# Patient Record
Sex: Female | Born: 1960 | Race: White | Hispanic: No | Marital: Married | Smoking: Former smoker
Health system: Southern US, Community
[De-identification: ages and names within clinical notes are randomized; demographics above are authoritative.]

## PROBLEM LIST (undated history)

## (undated) DIAGNOSIS — K635 Polyp of colon: Secondary | ICD-10-CM

## (undated) DIAGNOSIS — Z803 Family history of malignant neoplasm of breast: Secondary | ICD-10-CM

## (undated) DIAGNOSIS — K5792 Diverticulitis of intestine, part unspecified, without perforation or abscess without bleeding: Secondary | ICD-10-CM

## (undated) DIAGNOSIS — E079 Disorder of thyroid, unspecified: Secondary | ICD-10-CM

## (undated) DIAGNOSIS — R011 Cardiac murmur, unspecified: Secondary | ICD-10-CM

## (undated) DIAGNOSIS — F32A Depression, unspecified: Secondary | ICD-10-CM

## (undated) DIAGNOSIS — I10 Essential (primary) hypertension: Secondary | ICD-10-CM

## (undated) HISTORY — DX: Polyp of colon: K63.5

## (undated) HISTORY — DX: Diverticulitis of intestine, part unspecified, without perforation or abscess without bleeding: K57.92

## (undated) HISTORY — PX: URETHRAL SLING: SHX2621

## (undated) HISTORY — DX: Disorder of thyroid, unspecified: E07.9

## (undated) HISTORY — PX: SPINE SURGERY: SHX786

## (undated) HISTORY — DX: Family history of malignant neoplasm of breast: Z80.3

## (undated) HISTORY — DX: Cardiac murmur, unspecified: R01.1

## (undated) HISTORY — DX: Depression, unspecified: F32.A

## (undated) HISTORY — DX: Essential (primary) hypertension: I10

---

## 2008-11-18 HISTORY — PX: BACK SURGERY: SHX140

## 2014-08-10 ENCOUNTER — Ambulatory Visit (INDEPENDENT_AMBULATORY_CARE_PROVIDER_SITE_OTHER): Payer: BC Managed Care – PPO | Admitting: Emergency Medicine

## 2014-08-10 VITALS — BP 110/72 | HR 60 | Temp 98.3°F | Resp 16 | Ht 64.25 in | Wt 160.8 lb

## 2014-08-10 DIAGNOSIS — G4700 Insomnia, unspecified: Secondary | ICD-10-CM

## 2014-08-10 DIAGNOSIS — E039 Hypothyroidism, unspecified: Secondary | ICD-10-CM

## 2014-08-10 MED ORDER — ZOLPIDEM TARTRATE 5 MG PO TABS: 5.0000 mg | ORAL_TABLET | Freq: Every evening | ORAL | Status: DC | PRN

## 2014-08-10 NOTE — Progress Notes (Signed)
[facility] [address] [place] [address] [phone]  Date:  08/10/2014   Name:  Lisa Lynn   DOB:  [birth year]   MRN:  [id number]  PCP:  No PCP Per Patient    Chief Complaint: Insomnia   History of Present Illness:  Lisa Lynn is a 53 y.o. very pleasant female patient who presents with the following:  Has moved here from [place] and was on [place] for the past several years for treatment of insomnia Has exhausted her supply of medication and needs a refill. Is looking for a primary care physician No improvement with over the counter medications or other home remedies.  Denies other complaint or health concern today.   There are no active problems to display for this patient.   Past Medical History  Diagnosis Date  . Heart murmur   . Thyroid disease     Past Surgical History  Procedure Laterality Date  . Spine surgery      History  Substance Use Topics  . Smoking status: Never Smoker   . Smokeless tobacco: Not on file  . Alcohol Use: Yes     Comment: 2-4 drinks a week    Family History  Problem Relation Age of Onset  . Cancer Mother   . Hypertension Mother   . Heart disease Father   . Stroke Maternal Grandmother   . Cancer Paternal Grandmother   . Cancer Paternal Grandfather     No Known Allergies  Medication list has been reviewed and updated.  No current outpatient prescriptions on file prior to visit.   No current facility-administered medications on file prior to visit.    Review of Systems:  As per HPI, otherwise negative.    Physical Examination: Filed Vitals:   08/10/14 1933  BP: 110/72  Pulse: 60  Temp: 98.3 F (36.8 C)  Resp: 16   Filed Vitals:   08/10/14 1933  Height: 5' 4.25" (1.632 m)  Weight: 160 lb 12.8 oz (72.938 kg)   Body mass index is 27.39 kg/(m^2). Ideal Body Weight: Weight in (lb) to have BMI = 25: 146.5  GEN: WDWN, NAD, Non-toxic, A & O x 3 HEENT: Atraumatic,  Normocephalic. Neck supple. No masses, No LAD. Ears and Nose: No external deformity. CV: RRR, No M/G/R. No JVD. No thrill. No extra heart sounds. PULM: CTA B, no wheezes, crackles, rhonchi. No retractions. No resp. distress. No accessory muscle use. ABD: S, NT, ND, +BS. No rebound. No HSM. EXTR: No c/c/e NEURO Normal gait.  PSYCH: Normally interactive. Conversant. Not depressed or anxious appearing.  Calm demeanor.    Assessment and Plan: Insomnia Hypothyroid Refill ambien Refer to 104    Signed,  Phillips Odor, MD

## 2014-08-10 NOTE — Patient Instructions (Signed)
Insomnia Insomnia is frequent trouble falling and/or staying asleep. Insomnia can be a long term problem or a short term problem. Both are common. Insomnia can be a short term problem when the wakefulness is related to a certain stress or worry. Long term insomnia is often related to ongoing stress during waking hours and/or poor sleeping habits. Overtime, sleep deprivation itself can make the problem worse. Every little thing feels more severe because you are overtired and your ability to cope is decreased. CAUSES   Stress, anxiety, and depression.  Poor sleeping habits.  Distractions such as TV in the bedroom.  Naps close to bedtime.  Engaging in emotionally charged conversations before bed.  Technical reading before sleep.  Alcohol and other sedatives. They may make the problem worse. They can hurt normal sleep patterns and normal dream activity.  Stimulants such as caffeine for several hours prior to bedtime.  Pain syndromes and shortness of breath can cause insomnia.  Exercise late at night.  Changing time zones may cause sleeping problems (jet lag). It is sometimes helpful to have someone observe your sleeping patterns. They should look for periods of not breathing during the night (sleep apnea). They should also look to see how long those periods last. If you live alone or observers are uncertain, you can also be observed at a sleep clinic where your sleep patterns will be professionally monitored. Sleep apnea requires a checkup and treatment. Give your caregivers your medical history. Give your caregivers observations your family has made about your sleep.  SYMPTOMS   Not feeling rested in the morning.  Anxiety and restlessness at bedtime.  Difficulty falling and staying asleep. TREATMENT   Your caregiver may prescribe treatment for an underlying medical disorders. Your caregiver can give advice or help if you are using alcohol or other drugs for self-medication. Treatment  of underlying problems will usually eliminate insomnia problems.  Medications can be prescribed for short time use. They are generally not recommended for lengthy use.  Over-the-counter sleep medicines are not recommended for lengthy use. They can be habit forming.  You can promote easier sleeping by making lifestyle changes such as:  Using relaxation techniques that help with breathing and reduce muscle tension.  Exercising earlier in the day.  Changing your diet and the time of your last meal. No night time snacks.  Establish a regular time to go to bed.  Counseling can help with stressful problems and worry.  Soothing music and white noise may be helpful if there are background noises you cannot remove.  Stop tedious detailed work at least one hour before bedtime. HOME CARE INSTRUCTIONS   Keep a diary. Inform your caregiver about your progress. This includes any medication side effects. See your caregiver regularly. Take note of:  Times when you are asleep.  Times when you are awake during the night.  The quality of your sleep.  How you feel the next day. This information will help your caregiver care for you.  Get out of bed if you are still awake after 15 minutes. Read or do some quiet activity. Keep the lights down. Wait until you feel sleepy and go back to bed.  Keep regular sleeping and waking hours. Avoid naps.  Exercise regularly.  Avoid distractions at bedtime. Distractions include watching television or engaging in any intense or detailed activity like attempting to balance the household checkbook.  Develop a bedtime ritual. Keep a familiar routine of bathing, brushing your teeth, climbing into bed at the same   time each night, listening to soothing music. Routines increase the success of falling to sleep faster.  Use relaxation techniques. This can be using breathing and muscle tension release routines. It can also include visualizing peaceful scenes. You can  also help control troubling or intruding thoughts by keeping your mind occupied with boring or repetitive thoughts like the old concept of counting sheep. You can make it more creative like imagining planting one beautiful flower after another in your backyard garden.  During your day, work to eliminate stress. When this is not possible use some of the previous suggestions to help reduce the anxiety that accompanies stressful situations. MAKE SURE YOU:   Understand these instructions.  Will watch your condition.  Will get help right away if you are not doing well or get worse. Document Released: 11/01/2000 Document Revised: 01/27/2012 Document Reviewed: 12/02/2007 ExitCare Patient Information 2015 [employer]. This information is not intended to replace advice given to you by your health care provider. Make sure you discuss any questions you have with your health care provider.  

## 2014-08-22 NOTE — Progress Notes (Signed)
LMVM for pt to CB to schedule appt. 

## 2014-08-30 NOTE — Progress Notes (Signed)
LMOM for pt to call to sche appt at 104 for f/u

## 2014-08-31 ENCOUNTER — Encounter: Payer: Self-pay | Admitting: Family Medicine

## 2014-08-31 NOTE — Progress Notes (Signed)
Left a message for patient to return call also sent an unable to reach letter.

## 2014-09-29 ENCOUNTER — Other Ambulatory Visit (HOSPITAL_COMMUNITY)
Admission: RE | Admit: 2014-09-29 | Discharge: 2014-09-29 | Disposition: A | Discharge status: Home / Self Care | Payer: BC Managed Care – PPO | Source: Ambulatory Visit | Attending: Gynecology | Admitting: Gynecology

## 2014-09-29 ENCOUNTER — Encounter: Payer: Self-pay | Admitting: Gynecology

## 2014-09-29 ENCOUNTER — Ambulatory Visit (INDEPENDENT_AMBULATORY_CARE_PROVIDER_SITE_OTHER): Payer: BC Managed Care – PPO | Admitting: Gynecology

## 2014-09-29 VITALS — BP 124/82 | Ht 64.0 in | Wt 158.6 lb

## 2014-09-29 DIAGNOSIS — Z01419 Encounter for gynecological examination (general) (routine) without abnormal findings: Secondary | ICD-10-CM | POA: Insufficient documentation

## 2014-09-29 DIAGNOSIS — Z1151 Encounter for screening for human papillomavirus (HPV): Secondary | ICD-10-CM | POA: Diagnosis present

## 2014-09-29 DIAGNOSIS — Z780 Asymptomatic menopausal state: Secondary | ICD-10-CM

## 2014-09-29 DIAGNOSIS — Z79890 Hormone replacement therapy (postmenopausal): Secondary | ICD-10-CM

## 2014-09-29 DIAGNOSIS — I341 Nonrheumatic mitral (valve) prolapse: Secondary | ICD-10-CM

## 2014-09-29 MED ORDER — ESTRADIOL 0.1 MG/24HR TD PTTW: 1.0000 | MEDICATED_PATCH | TRANSDERMAL | Status: DC

## 2014-09-29 MED ORDER — PROGESTERONE MICRONIZED 200 MG PO CAPS: 200.0000 mg | ORAL_CAPSULE | Freq: Every day | ORAL | Status: DC

## 2014-09-29 NOTE — Patient Instructions (Signed)
kkTdap Vaccine (Tetanus, Diphtheria, Pertussis): What You Need to Know 1. Why get vaccinated? Tetanus, diphtheria and pertussis can be very serious diseases, even for adolescents and adults. Tdap vaccine can protect us from these diseases. TETANUS (Lockjaw) causes painful muscle tightening and stiffness, usually all over the body.  It can lead to tightening of muscles in the head and neck so you can't open your mouth, swallow, or sometimes even breathe. Tetanus kills about 1 out of 5 people who are infected. DIPHTHERIA can cause a thick coating to form in the back of the throat.  It can lead to breathing problems, paralysis, heart failure, and death. PERTUSSIS (Whooping Cough) causes severe coughing spells, which can cause difficulty breathing, vomiting and disturbed sleep.  It can also lead to weight loss, incontinence, and rib fractures. Up to 2 in 100 adolescents and 5 in 100 adults with pertussis are hospitalized or have complications, which could include pneumonia or death. These diseases are caused by bacteria. Diphtheria and pertussis are spread from person to person through coughing or sneezing. Tetanus enters the body through cuts, scratches, or wounds. Before vaccines, the [place] saw as many as 200,000 cases a year of diphtheria and pertussis, and hundreds of cases of tetanus. Since vaccination began, tetanus and diphtheria have dropped by about 99% and pertussis by about 80%. 2. Tdap vaccine Tdap vaccine can protect adolescents and adults from tetanus, diphtheria, and pertussis. One dose of Tdap is routinely given at age 111 or 2512. People who did not get Tdap at that age should get it as soon as possible. Tdap is especially important for health care professionals and anyone having close contact with a baby younger than 12 months. Pregnant women should get a dose of Tdap during every pregnancy, to protect the newborn from pertussis. Infants are most at risk for severe,  life-threatening complications from pertussis. A similar vaccine, called Td, protects from tetanus and diphtheria, but not pertussis. A Td booster should be given every 10 years. Tdap may be given as one of these boosters if you have not already gotten a dose. Tdap may also be given after a severe cut or burn to prevent tetanus infection. Your doctor can give you more information. Tdap may safely be given at the same time as other vaccines. 3. Some people should not get this vaccine  If you ever had a life-threatening allergic reaction after a dose of any tetanus, diphtheria, or pertussis containing vaccine, OR if you have a severe allergy to any part of this vaccine, you should not get Tdap. Tell your doctor if you have any severe allergies.  If you had a coma, or long or multiple seizures within 7 days after a childhood dose of DTP or DTaP, you should not get Tdap, unless a cause other than the vaccine was found. You can still get Td.  Talk to your doctor if you:  have epilepsy or another nervous system problem,  had severe pain or swelling after any vaccine containing diphtheria, tetanus or pertussis,  ever had Guillain-Barr Syndrome (GBS),  aren't feeling well on the day the shot is scheduled. 4. Risks of a vaccine reaction With any medicine, including vaccines, there is a chance of side effects. These are usually mild and go away on their own, but serious reactions are also possible. Brief fainting spells can follow a vaccination, leading to injuries from falling. Sitting or lying down for about 15 minutes can help prevent these. Tell your doctor if you feel  dizzy or light-headed, or have vision changes or ringing in the ears. Mild problems following Tdap (Did not interfere with activities)  Pain where the shot was given (about 3 in 4 adolescents or 2 in 3 adults)  Redness or swelling where the shot was given (about 1 person in 5)  Mild fever of at least 100.41F (up to about 1 in  25 adolescents or 1 in 100 adults)  Headache (about 3 or 4 people in 10)  Tiredness (about 1 person in 3 or 4)  Nausea, vomiting, diarrhea, stomach ache (up to 1 in 4 adolescents or 1 in 10 adults)  Chills, body aches, sore joints, rash, swollen glands (uncommon) Moderate problems following Tdap (Interfered with activities, but did not require medical attention)  Pain where the shot was given (about 1 in 5 adolescents or 1 in 100 adults)  Redness or swelling where the shot was given (up to about 1 in 16 adolescents or 1 in 25 adults)  Fever over 102F (about 1 in 100 adolescents or 1 in 250 adults)  Headache (about 3 in 20 adolescents or 1 in 10 adults)  Nausea, vomiting, diarrhea, stomach ache (up to 1 or 3 people in 100)  Swelling of the entire arm where the shot was given (up to about 3 in 100). Severe problems following Tdap (Unable to perform usual activities; required medical attention)  Swelling, severe pain, bleeding and redness in the arm where the shot was given (rare). A severe allergic reaction could occur after any vaccine (estimated less than 1 in a million doses). 5. What if there is a serious reaction? What should I look for?  Look for anything that concerns you, such as signs of a severe allergic reaction, very high fever, or behavior changes. Signs of a severe allergic reaction can include hives, swelling of the face and throat, difficulty breathing, a fast heartbeat, dizziness, and weakness. These would start a few minutes to a few hours after the vaccination. What should I do?  If you think it is a severe allergic reaction or other emergency that can't wait, call 9-1-1 or get the person to the nearest hospital. Otherwise, call your doctor.  Afterward, the reaction should be reported to the "Vaccine Adverse Event Reporting System" (VAERS). Your doctor might file this report, or you can do it yourself through the VAERS web site at www.vaers.LAgents.no, or by  calling 1-636 840 8494. VAERS is only for reporting reactions. They do not give medical advice.  6. The National Vaccine Injury Compensation Program The [employer] Vaccine Injury Compensation Program (VICP) is a federal program that was created to compensate people who may have been injured by certain vaccines. Persons who believe they may have been injured by a vaccine can learn about the program and about filing a claim by calling 1-715 581 2927 or visiting the VICP website at SpiritualWord.at. 7. How can I learn more?  Ask your doctor.  Call your local or state health department.  Contact the Centers for Disease Control and Prevention (CDC):  Call (631)524-5944 or visit CDC's website at PicCapture.uy. CDC Tdap Vaccine VIS (03/26/12) Document Released: 05/05/2012 Document Revised: 03/21/2014 Document Reviewed: 02/16/2014 ExitCare Patient Information 2015 [place], [place]. This information is not intended to replace advice given to you by your health care provider. Make sure you discuss any questions you have with your health care provider. Hormone Therapy At menopause, your body begins making less estrogen and progesterone hormones. This causes the body to stop having menstrual periods. This is because  estrogen and progesterone hormones control your periods and menstrual cycle. A lack of estrogen may cause symptoms such as:  Hot flushes (or hot flashes).  Vaginal dryness.  Dry skin.  Loss of sex drive.  Risk of bone loss (osteoporosis). When this happens, you may choose to take hormone therapy to get back the estrogen lost during menopause. When the hormone estrogen is given alone, it is usually referred to as ET (Estrogen Therapy). When the hormone progestin is combined with estrogen, it is generally called HT (Hormone Therapy). This was formerly known as hormone replacement therapy (HRT). Your caregiver can help you make a decision on what will be best for you.  The decision to use HT seems to change often as new studies are done. Many studies do not agree on the benefits of hormone replacement therapy. LIKELY BENEFITS OF HT INCLUDE PROTECTION FROM:  Hot Flushes (also called hot flashes) - A hot flush is a sudden feeling of heat that spreads over the face and body. The skin may redden like a blush. It is connected with sweats and sleep disturbance. Women going through menopause may have hot flushes a few times a month or several times per day depending on the woman.  Osteoporosis (bone loss)- Estrogen helps guard against bone loss. After menopause, a woman's bones slowly lose calcium and become weak and brittle. As a result, bones are more likely to break. The hip, wrist, and spine are affected most often. Hormone therapy can help slow bone loss after menopause. Weight bearing exercise and taking calcium with vitamin D also can help prevent bone loss. There are also medications that your caregiver can prescribe that can help prevent osteoporosis.  Vaginal Dryness - Loss of estrogen causes changes in the vagina. Its lining may become thin and dry. These changes can cause pain and bleeding during sexual intercourse. Dryness can also lead to infections. This can cause burning and itching. (Vaginal estrogen treatment can help relieve pain, itching, and dryness.)  Urinary Tract Infections are more common after menopause because of lack of estrogen. Some women also develop urinary incontinence because of low estrogen levels in the vagina and bladder.  Possible other benefits of estrogen include a positive effect on mood and short-term memory in women. RISKS AND COMPLICATIONS  Using estrogen alone without progesterone causes the lining of the uterus to grow. This increases the risk of lining of the uterus (endometrial) cancer. Your caregiver should give another hormone called progestin if you have a uterus.  Women who take combined (estrogen and progestin) HT  appear to have an increased risk of breast cancer. The risk appears to be small, but increases throughout the time that HT is taken.  Combined therapy also makes the breast tissue slightly denser which makes it harder to read mammograms (breast X-rays).  Combined, estrogen and progesterone therapy can be taken together every day, in which case there may be spotting of blood. HT therapy can be taken cyclically in which case you will have menstrual periods. Cyclically means HT is taken for a set amount of days, then not taken, then this process is repeated.  HT may increase the risk of stroke, heart attack, breast cancer and forming blood clots in your leg.  Transdermal estrogen (estrogen that is absorbed through the skin with a patch or a cream) may have more positive results with:  Cholesterol.  Blood pressure.  Blood clots. Having the following conditions may indicate you should not have HT:  Endometrial cancer.  Liver  disease.  Breast cancer.  Heart disease.  History of blood clots.  Stroke. TREATMENT   If you choose to take HT and have a uterus, usually estrogen and progestin are prescribed.  Your caregiver will help you decide the best way to take the medications.  Possible ways to take estrogen include:  Pills.  Patches.  Gels.  Sprays.  Vaginal estrogen cream, rings and tablets.  It is best to take the lowest dose possible that will help your symptoms and take them for the shortest period of time that you can.  Hormone therapy can help relieve some of the problems (symptoms) that affect women at menopause. Before making a decision about HT, talk to your caregiver about what is best for you. Be well informed and comfortable with your decisions. HOME CARE INSTRUCTIONS   Follow your caregivers advice when taking the medications.  A Pap test is done to screen for cervical cancer.  The first Pap test should be done at age 53.  Between ages 3121 and 5729, Pap  tests are repeated every 2 years.  Beginning at age 53, you are advised to have a Pap test every 3 years as long as your past 3 Pap tests have been normal.  Some women have medical problems that increase the chance of getting cervical cancer. Talk to your caregiver about these problems. It is especially important to talk to your caregiver if a new problem develops soon after your last Pap test. In these cases, your caregiver may recommend more frequent screening and Pap tests.  The above recommendations are the same for women who have or have not gotten the vaccine for HPV (Human Papillomavirus).  If you had a hysterectomy for a problem that was not a cancer or a condition that could lead to cancer, then you no longer need Pap tests. However, even if you no longer need a Pap test, a regular exam is a good idea to make sure no other problems are starting.   If you are between ages 2465 and 4970, and you have had normal Pap tests going back 10 years, you no longer need Pap tests. However, even if you no longer need a Pap test, a regular exam is a good idea to make sure no other problems are starting.   If you have had past treatment for cervical cancer or a condition that could lead to cancer, you need Pap tests and screening for cancer for at least 20 years after your treatment.  If Pap tests have been discontinued, risk factors (such as a new sexual partner) need to be re-assessed to determine if screening should be resumed.  Some women may need screenings more often if they are at high risk for cervical cancer.  Get mammograms done as per the advice of your caregiver. SEEK IMMEDIATE MEDICAL CARE IF:  You develop abnormal vaginal bleeding.  You have pain or swelling in your legs, shortness of breath, or chest pain.  You develop dizziness or headaches.  You have lumps or changes in your breasts or armpits.  You have slurred speech.  You develop weakness or numbness of your arms or  legs.  You have pain, burning, or bleeding when urinating.  You develop abdominal pain. Document Released: 08/03/2003 Document Revised: 01/27/2012 Document Reviewed: 11/21/2010 [facility] Patient Information 2015 [place], [place]. This information is not intended to replace advice given to you by your health care provider. Make sure you discuss any questions you have with your health care provider.

## 2014-09-29 NOTE — Progress Notes (Signed)
Lisa Lynn December 30, 1960 [id number]   History:    53 y.o.  for annual gyn exam who is a new patient to the practice. [place]. She reports her last gynecological examination in 2014. Patient denies any past history of abnormal Pap smears. She is being followed by Dr. Leslie DalesAltheimer  (endocrinologist) who is treating her for Hashimoto's thyroiditis. Patient with past history of mitral valve prolapse proven by echocardiography many years ago her cardiologist had released her since she was asymptomatic. Patient was started on estrogen replacement therapy for vasomotor symptoms and 2013 and she had stopped it 2 weeks ago to see if she really needed it and she would like to return to it. Patient was on Minivelle 0.1 mg twice a week transdermal patch with the addition of Prometrium 200 mg for 12 days of the month. Patient uncertain if she has received her T.vaccine. Her flu vaccine is up-to-date.  She brought records with her and in October of this year she had some spotting and they did a hysteroscopy and endometrial biopsy which were benign.  Past medical history,surgical history, family history and social history were all reviewed and documented in the EPIC chart.  Gynecologic History No LMP recorded. Patient is postmenopausal. Contraception: post menopausal status Last Pap: 2014. Results were: normal Last mammogram: 2014. Results were: normal  Obstetric History OB History  Gravida Para Term Preterm AB SAB TAB Ectopic Multiple Living  3 2   1 1    2     # Outcome Date GA Lbr Len/2nd Weight Sex Delivery Anes PTL Lv  3 SAB           2 Para           1 Para                ROS: A ROS was performed and pertinent positives and negatives are included in the history.  GENERAL: No fevers or chills. HEENT: No change in vision, no earache, sore throat or sinus congestion. NECK: No pain or stiffness. CARDIOVASCULAR: No chest pain or  pressure. No palpitations. PULMONARY: No shortness of breath, cough or wheeze. GASTROINTESTINAL: No abdominal pain, nausea, vomiting or diarrhea, melena or bright red blood per rectum. GENITOURINARY: No urinary frequency, urgency, hesitancy or dysuria. MUSCULOSKELETAL: No joint or muscle pain, no back pain, no recent trauma. DERMATOLOGIC: No rash, no itching, no lesions. ENDOCRINE: No polyuria, polydipsia, no heat or cold intolerance. No recent change in weight. HEMATOLOGICAL: No anemia or easy bruising or bleeding. NEUROLOGIC: No headache, seizures, numbness, tingling or weakness. PSYCHIATRIC: No depression, no loss of interest in normal activity or change in sleep pattern.     Exam: chaperone present  BP 124/82 mmHg  Ht 5\' 4"  (1.626 m)  Wt 158 lb 9.6 oz (71.94 kg)  BMI 27.21 kg/m2  Body mass index is 27.21 kg/(m^2).  General appearance : Well developed well nourished female. No acute distress HEENT: Neck supple, trachea midline, no carotid bruits, no thyroidmegaly Lungs: Clear to auscultation, no rhonchi or wheezes, or rib retractions  Heart: Regular rate and rhythm, no murmurs or gallops Breast:Examined in sitting and supine position were symmetrical in appearance, no palpable masses or tenderness,  no skin retraction, no nipple inversion, no nipple discharge, no skin discoloration, no axillary or supraclavicular lymphadenopathy Abdomen: no palpable masses or tenderness, no rebound or guarding Extremities: no edema or skin discoloration or tenderness  Pelvic:  Bartholin, Urethra, Skene  Glands: Within normal limits             Vagina: No gross lesions or discharge  Cervix: No gross lesions or discharge  Uterus  anteverted, normal size, shape and consistency, non-tender and mobile  Adnexa  Without masses or tenderness  Anus and perineum  normal   Rectovaginal  normal sphincter tone without palpated masses or tenderness             Hemoccult cards provided.     Assessment/Plan:  53  y.o. female for annual exam new patient to the practice. We will obtain a Pap smear to have in her records here and then we will adhered to the guidelines of Pap smears every 3 years. We discussed importance of monthly self breast exams. She will schedule her mammogram. We discussed importance of calcium vitamin D and regular exercise for osteoporosis prevention. Next year we will do a baseline bone density study. She will return back later in the week for fasting lipid profile, compresses a metabolic panel, CBC, and urinalysis. She was reminded to submit to the office the fecal Hemoccult cards for testing. She will check on her T Dap status.   Ok EdwardsFERNANDEZ,Yeimi Debnam H MD, 3:25 PM 09/29/2014

## 2014-09-30 LAB — URINALYSIS W MICROSCOPIC + REFLEX CULTURE
Bacteria, UA: NONE SEEN
Bilirubin Urine: NEGATIVE
Casts: NONE SEEN
Crystals: NONE SEEN
Glucose, UA: NEGATIVE mg/dL
Hgb urine dipstick: NEGATIVE
Ketones, ur: NEGATIVE mg/dL
Leukocytes, UA: NEGATIVE
Nitrite: NEGATIVE
Protein, ur: NEGATIVE mg/dL
Specific Gravity, Urine: 1.014 (ref 1.005–1.030)
Squamous Epithelial / LPF: NONE SEEN
Urobilinogen, UA: 0.2 mg/dL (ref 0.0–1.0)
pH: 5.5 (ref 5.0–8.0)

## 2014-10-03 LAB — CYTOLOGY - PAP

## 2014-10-04 ENCOUNTER — Other Ambulatory Visit: Payer: BC Managed Care – PPO

## 2014-10-04 LAB — CBC WITH DIFFERENTIAL/PLATELET
Basophils Absolute: 0 10*3/uL (ref 0.0–0.1)
Basophils Relative: 0 % (ref 0–1)
Eosinophils Absolute: 0.1 10*3/uL (ref 0.0–0.7)
Eosinophils Relative: 2 % (ref 0–5)
HCT: 37.7 % (ref 36.0–46.0)
Hemoglobin: 13.3 g/dL (ref 12.0–15.0)
Lymphocytes Relative: 34 % (ref 12–46)
Lymphs Abs: 1.5 10*3/uL (ref 0.7–4.0)
MCH: 29.4 pg (ref 26.0–34.0)
MCHC: 35.3 g/dL (ref 30.0–36.0)
MCV: 83.4 fL (ref 78.0–100.0)
MPV: 10 fL (ref 9.4–12.4)
Monocytes Absolute: 0.3 10*3/uL (ref 0.1–1.0)
Monocytes Relative: 7 % (ref 3–12)
Neutro Abs: 2.6 10*3/uL (ref 1.7–7.7)
Neutrophils Relative %: 57 % (ref 43–77)
Platelets: 199 10*3/uL (ref 150–400)
RBC: 4.52 MIL/uL (ref 3.87–5.11)
RDW: 13.8 % (ref 11.5–15.5)
WBC: 4.5 10*3/uL (ref 4.0–10.5)

## 2014-10-04 LAB — COMPREHENSIVE METABOLIC PANEL
ALT: 13 U/L (ref 0–35)
AST: 14 U/L (ref 0–37)
Albumin: 4.1 g/dL (ref 3.5–5.2)
Alkaline Phosphatase: 48 U/L (ref 39–117)
BUN: 14 mg/dL (ref 6–23)
CO2: 28 mEq/L (ref 19–32)
Calcium: 9 mg/dL (ref 8.4–10.5)
Chloride: 103 mEq/L (ref 96–112)
Creat: 0.83 mg/dL (ref 0.50–1.10)
Glucose, Bld: 87 mg/dL (ref 70–99)
Potassium: 4.5 mEq/L (ref 3.5–5.3)
Sodium: 139 mEq/L (ref 135–145)
Total Bilirubin: 0.8 mg/dL (ref 0.2–1.2)
Total Protein: 6.5 g/dL (ref 6.0–8.3)

## 2014-10-04 LAB — LIPID PANEL
Cholesterol: 188 mg/dL (ref 0–200)
HDL: 70 mg/dL (ref >39)
LDL Cholesterol: 108 mg/dL — ABNORMAL HIGH (ref 0–99)
Total CHOL/HDL Ratio: 2.7 Ratio
Triglycerides: 48 mg/dL (ref <150)
VLDL: 10 mg/dL (ref 0–40)

## 2014-10-18 ENCOUNTER — Other Ambulatory Visit: Payer: BC Managed Care – PPO | Admitting: Anesthesiology

## 2014-10-18 DIAGNOSIS — Z1211 Encounter for screening for malignant neoplasm of colon: Secondary | ICD-10-CM

## 2014-10-20 ENCOUNTER — Other Ambulatory Visit: Payer: Self-pay

## 2014-10-20 DIAGNOSIS — Z1231 Encounter for screening mammogram for malignant neoplasm of breast: Secondary | ICD-10-CM

## 2014-11-23 ENCOUNTER — Ambulatory Visit
Admission: RE | Admit: 2014-11-23 | Discharge: 2014-11-23 | Disposition: A | Discharge status: Home / Self Care | Payer: BLUE CROSS/BLUE SHIELD | Source: Ambulatory Visit

## 2014-11-23 DIAGNOSIS — Z1231 Encounter for screening mammogram for malignant neoplasm of breast: Secondary | ICD-10-CM

## 2015-01-10 ENCOUNTER — Ambulatory Visit (INDEPENDENT_AMBULATORY_CARE_PROVIDER_SITE_OTHER): Payer: BLUE CROSS/BLUE SHIELD | Admitting: Urgent Care

## 2015-01-10 VITALS — BP 128/66 | HR 78 | Temp 97.8°F | Resp 19 | Ht 64.5 in | Wt 159.8 lb

## 2015-01-10 DIAGNOSIS — R300 Dysuria: Secondary | ICD-10-CM

## 2015-01-10 DIAGNOSIS — R319 Hematuria, unspecified: Secondary | ICD-10-CM

## 2015-01-10 DIAGNOSIS — R3915 Urgency of urination: Secondary | ICD-10-CM

## 2015-01-10 DIAGNOSIS — N390 Urinary tract infection, site not specified: Secondary | ICD-10-CM

## 2015-01-10 LAB — POCT URINALYSIS DIPSTICK
Bilirubin, UA: NEGATIVE
Glucose, UA: NEGATIVE
Ketones, UA: 15
Nitrite, UA: POSITIVE
Protein, UA: 30
Spec Grav, UA: 1.01
Urobilinogen, UA: 0.2
pH, UA: 5.5

## 2015-01-10 LAB — POCT UA - MICROSCOPIC ONLY
Bacteria, U Microscopic: NEGATIVE
Casts, Ur, LPF, POC: NEGATIVE
Crystals, Ur, HPF, POC: NEGATIVE
Epithelial cells, urine per micros: NEGATIVE
Mucus, UA: NEGATIVE
Yeast, UA: NEGATIVE

## 2015-01-10 MED ORDER — PHENAZOPYRIDINE HCL 200 MG PO TABS: 200.0000 mg | ORAL_TABLET | Freq: Three times a day (TID) | ORAL | Status: DC | PRN

## 2015-01-10 MED ORDER — CIPROFLOXACIN HCL 500 MG PO TABS: 500.0000 mg | ORAL_TABLET | Freq: Two times a day (BID) | ORAL | Status: DC

## 2015-01-10 NOTE — Patient Instructions (Signed)
-   Please start Ciprofloxacin, an antibiotic, for urinary tract infection. You will take this for 10 days.  - If your symptoms worsen before you hear from us regarding your culture results, please call and let us know. - You may use pyridium for painful urination for the next 2-3 days as needed.   Urinary Tract Infection Urinary tract infections (UTIs) can develop anywhere along your urinary tract. Your urinary tract is your body's drainage system for removing wastes and extra water. Your urinary tract includes two kidneys, two ureters, a bladder, and a urethra. Your kidneys are a pair of bean-shaped organs. Each kidney is about the size of your fist. They are located below your ribs, one on each side of your spine. CAUSES Infections are caused by microbes, which are microscopic organisms, including fungi, viruses, and bacteria. These organisms are so small that they can only be seen through a microscope. Bacteria are the microbes that most commonly cause UTIs. SYMPTOMS  Symptoms of UTIs may vary by age and gender of the patient and by the location of the infection. Symptoms in young women typically include a frequent and intense urge to urinate and a painful, burning feeling in the bladder or urethra during urination. Older women and men are more likely to be tired, shaky, and weak and have muscle aches and abdominal pain. A fever may mean the infection is in your kidneys. Other symptoms of a kidney infection include pain in your back or sides below the ribs, nausea, and vomiting. DIAGNOSIS To diagnose a UTI, your caregiver will ask you about your symptoms. Your caregiver also will ask to provide a urine sample. The urine sample will be tested for bacteria and white blood cells. White blood cells are made by your body to help fight infection. TREATMENT  Typically, UTIs can be treated with medication. Because most UTIs are caused by a bacterial infection, they usually can be treated with the use of  antibiotics. The choice of antibiotic and length of treatment depend on your symptoms and the type of bacteria causing your infection. HOME CARE INSTRUCTIONS  If you were prescribed antibiotics, take them exactly as your caregiver instructs you. Finish the medication even if you feel better after you have only taken some of the medication.  Drink enough water and fluids to keep your urine clear or pale yellow.  Avoid caffeine, tea, and carbonated beverages. They tend to irritate your bladder.  Empty your bladder often. Avoid holding urine for long periods of time.  Empty your bladder before and after sexual intercourse.  After a bowel movement, women should cleanse from front to back. Use each tissue only once. SEEK MEDICAL CARE IF:   You have back pain.  You develop a fever.  Your symptoms do not begin to resolve within 3 days. SEEK IMMEDIATE MEDICAL CARE IF:   You have severe back pain or lower abdominal pain.  You develop chills.  You have nausea or vomiting.  You have continued burning or discomfort with urination. MAKE SURE YOU:   Understand these instructions.  Will watch your condition.  Will get help right away if you are not doing well or get worse. Document Released: 08/14/2005 Document Revised: 05/05/2012 Document Reviewed: 12/13/2011 [facility] Patient Information 2015 [place] [place]. This information is not intended to replace advice given to you by your health care provider. Make sure you discuss any questions you have with your health care provider.

## 2015-01-10 NOTE — Progress Notes (Signed)
    MRN: [id number] DOB: 28-Dec-1960  Subjective:   Lisa Lynn is a 54 y.o. female presenting for chief complaint of Urinary Urgency; Pelvic Pain; and Hematuria  Reports onset of urinary symptoms today. Has urinary urgency, dysuria, hematuria, slightly cloudy urine, pelvic pain. Denies fevers, n/v, flank pain, abdominal pain, vaginal discharge, genital rashes or otherwise. Has taken 2 Tylenol with some relief. Has had ~2 UTI's in her lifetime, states that previously her symptoms have had rapid onset but have resolved with antibiotic course. Patient is post-menopausal. Does not smoke, drinks glass of wine at dinner 3-4x/week. Denies any other aggravating or relieving factors, no other questions or concerns.  Lisa Lynn has a current medication list which includes the following prescription(s): levothyroxine, zolpidem, ciprofloxacin, and phenazopyridine. She has No Known Allergies.  Lisa Lynn  has a past medical history of Heart murmur and Thyroid disease. Also  has past surgical history that includes Spine surgery and Back surgery (2010).  ROS As in subjective.  Objective:   Vitals: BP 128/66 mmHg  Pulse 78  Temp(Src) 97.8 F (36.6 C) (Oral)  Resp 19  Ht 5' 4.5" (1.638 m)  Wt 159 lb 12.8 oz (72.485 kg)  BMI 27.02 kg/m2  SpO2 98%  Physical Exam  Constitutional: She is oriented to person, place, and time and well-developed, well-nourished, and in no distress.  Cardiovascular: Normal rate, regular rhythm and intact distal pulses.  Exam reveals no gallop and no friction rub.   No murmur heard. Pulmonary/Chest: No respiratory distress. She has no wheezes. She has no rales. She exhibits no tenderness.  Abdominal: Soft. Bowel sounds are normal. She exhibits no distension and no mass. There is no tenderness.  No CVA tenderness.  Neurological: She is alert and oriented to person, place, and time.  Skin: Skin is warm and dry. No rash noted. No erythema.   Results for orders placed or  performed in visit on 01/10/15 (from the past 24 hour(s))  POCT urinalysis dipstick     Status: None   Collection Time: 01/10/15  5:30 PM  Result Value Ref Range   Color, UA yellow    Clarity, UA cloudy    Glucose, UA neg    Bilirubin, UA neg    Ketones, UA 15    Spec Grav, UA 1.010    Blood, UA large    pH, UA 5.5    Protein, UA 30    Urobilinogen, UA 0.2    Nitrite, UA positive    Leukocytes, UA large (3+)   POCT UA - Microscopic Only     Status: None   Collection Time: 01/10/15  5:32 PM  Result Value Ref Range   WBC, Ur, HPF, POC TNTC    RBC, urine, microscopic TNTC    Bacteria, U Microscopic NEG    Mucus, UA NEG    Epithelial cells, urine per micros NEG    Crystals, Ur, HPF, POC NEG    Casts, Ur, LPF, POC NEG    Yeast, UA NEG    Assessment and Plan :   1. Urinary urgency 2. Urinary tract infection with hematuria, site unspecified 3. Dysuria - Start ciprofloxacin x7 days, urine culture pending - Advised pyridium for painful urination - Return to clinic if symptoms worsen despite antibiotic course  Wallis BambergMario Julyssa Kyer, PA-C [facility] [phone] 01/10/2015 5:46 PM

## 2015-01-12 LAB — URINE CULTURE: Colony Count: >=100000

## 2015-02-22 ENCOUNTER — Ambulatory Visit
Admission: RE | Admit: 2015-02-22 | Discharge: 2015-02-22 | Disposition: A | Discharge status: Home / Self Care | Payer: BLUE CROSS/BLUE SHIELD | Source: Ambulatory Visit | Attending: Chiropractic Medicine | Admitting: Chiropractic Medicine

## 2015-02-22 ENCOUNTER — Other Ambulatory Visit: Payer: Self-pay | Admitting: Chiropractic Medicine

## 2015-02-22 DIAGNOSIS — E063 Autoimmune thyroiditis: Secondary | ICD-10-CM

## 2015-05-29 ENCOUNTER — Other Ambulatory Visit: Payer: Self-pay

## 2015-05-29 MED ORDER — ESTRADIOL 0.1 MG/24HR TD PTTW: 1.0000 | MEDICATED_PATCH | TRANSDERMAL | Status: DC

## 2015-07-27 ENCOUNTER — Ambulatory Visit (INDEPENDENT_AMBULATORY_CARE_PROVIDER_SITE_OTHER): Payer: BLUE CROSS/BLUE SHIELD | Admitting: Gynecology

## 2015-07-27 ENCOUNTER — Encounter: Payer: Self-pay | Admitting: Gynecology

## 2015-07-27 VITALS — BP 118/72

## 2015-07-27 DIAGNOSIS — Z79890 Hormone replacement therapy (postmenopausal): Secondary | ICD-10-CM | POA: Diagnosis not present

## 2015-07-27 DIAGNOSIS — Z23 Encounter for immunization: Secondary | ICD-10-CM | POA: Diagnosis not present

## 2015-07-27 DIAGNOSIS — N951 Menopausal and female climacteric states: Secondary | ICD-10-CM | POA: Diagnosis not present

## 2015-07-27 MED ORDER — MEDROXYPROGESTERONE ACETATE 10 MG PO TABS: ORAL_TABLET | ORAL | Status: DC

## 2015-07-27 MED ORDER — EST ESTROGENS-METHYLTEST 0.625-1.25 MG PO TABS: 1.0000 | ORAL_TABLET | Freq: Every day | ORAL | Status: DC

## 2015-07-27 NOTE — Addendum Note (Signed)
Addended by: Berna Spare A on: 07/27/2015 02:59 PM   Modules accepted: Orders

## 2015-07-27 NOTE — Patient Instructions (Signed)
Esterified Estrogens; Methyltestosterone tablets What is this medicine? ESTERIFIED ESTROGENS; METHYLTESTOSTERONE (es TAIR i fyed ES troe jenz; meth il tes TOS te rone) is a combination of hormones. This medicine is used to treat some of the symptoms of menopause like hot flashes and vaginal dryness. This medicine may be used for other purposes; ask your health care provider or pharmacist if you have questions. COMMON BRAND NAME(S): Covaryx, Covaryx H.S., EEMT, EEMT HS, Essian, Essian HS, Estratest, Estratest HS, Syntest DS, Syntest HS What should I tell my health care provider before I take this medicine? They need to know if you have any of these conditions: -abnormal vaginal bleeding -blood vessel disease or blood clots -breast, cervical, endometrial, ovarian, liver, or uterine cancer -dementia -diabetes -gallbladder disease -heart disease or recent heart attack -high blood pressure -high cholesterol -high level of calcium in the blood -hysterectomy -kidney disease -liver disease -migraine headaches -stroke -systemic lupus erythematosus (SLE) -tobacco smoker -vaginal bleeding -an unusual or allergic reaction to estrogens, other hormones, medicines, foods, dyes, or preservatives -pregnant or trying to get pregnant -breast-feeding How should I use this medicine? Take this medicine by mouth with a glass of water. To reduce nausea, this medicine may be taken with food. Follow the directions on the prescription label. Take this medicine at the same time each day. Do not take your medicine more often than directed. Talk to your pediatrician regarding the use of this medicine in children. Special care may be needed. A patient package insert for the product will be given with each prescription and refill. Read this sheet carefully each time. The sheet may change frequently. Overdosage: If you think you have taken too much of this medicine contact a poison control center or emergency room at  once. NOTE: This medicine is only for you. Do not share this medicine with others. What if I miss a dose? If you miss a dose, take it as soon as you can. If it is almost time for your next dose, take only that dose. Do not take double or extra doses. What may interact with this medicine? Do not take this medicine with any of the following medications: -medicines for cancer like aminoglutethimide, anastrozole, exemestane, letrozole, testolactone, vorozole This medicine may also interact with the following medications: -antibiotics like erythromycin, clarithromycin -carbamazepine -female hormones, like estrogens or progestins and birth control pills -grapefruit juice -herbal remedies for menopause or female problems -insulin -itraconazole -ketoconazole -medicines that treat or prevent blood clots like warfarin -oxyphenbutazone -phenobarbital -rifampin -ritonavir -St. John's Wort This list may not describe all possible interactions. Give your health care provider a list of all the medicines, herbs, non-prescription drugs, or dietary supplements you use. Also tell them if you smoke, drink alcohol, or use illegal drugs. Some items may interact with your medicine. What should I watch for while using this medicine? Visit your doctor or health care professional for regular checks on your progress. You will need a regular breast and pelvic exam and Pap smear while on this medicine. You should also discuss the need for regular mammograms with your health care professional, and follow his or her guidelines for these tests. This medicine can make your body retain fluid, making your fingers, hands, or ankles swell. Your blood pressure can go up. Contact your doctor or health care professional if you feel you are retaining fluid. If you have any reason to think you are pregnant, stop taking this medicine right away and contact your doctor or health care professional. Smoking  increases the risk of  getting a blood clot or having a stroke while you are taking this medicine, especially if you are more than 54 years old. You are strongly advised not to smoke. If you wear contact lenses and notice visual changes, or if the lenses begin to feel uncomfortable, consult your eye doctor or health care professional. This medicine can increase the risk of developing a condition (endometrial hyperplasia) that may lead to cancer of the lining of the uterus. Taking progestins, another hormone drug, with this medicine lowers the risk of developing this condition. Therefore, if your uterus has not been removed (by a hysterectomy), your doctor may prescribe a progestin for you to take together with your estrogen. You should know, however, that taking estrogens with progestins may have additional health risks. You should discuss the use of estrogens and progestins with your health care professional to determine the benefits and risks for you. If you are going to have surgery, you may need to stop taking this medicine. Consult your health care professional for advice before you schedule the surgery. What side effects may I notice from receiving this medicine? Side effects that you should report to your doctor or health care professional as soon as possible: -allergic reactions like skin rash, itching or hives, swelling of the face, lips, or tongue -breast tissue changes or discharge -changes in vision -chest pain -confusion, trouble speaking or understanding -dark urine -general ill feeling or flu-like symptoms -light-colored stools -nausea, vomiting -pain, swelling, warmth in the leg -right upper belly pain -severe headaches -shortness of breath -sudden numbness or weakness of the face, arm or leg -trouble walking, dizziness, loss of balance or coordination -unusual vaginal bleeding -yellowing of the eyes or skin Side effects that usually do not require medical attention (report to your doctor or health  care professional if they continue or are bothersome): -hair loss -increased hunger or thirst -increased urination -symptoms of vaginal infection like itching, irritation or unusual discharge -unusually weak or tired This list may not describe all possible side effects. Call your doctor for medical advice about side effects. You may report side effects to FDA at 1-800-FDA-1088. Where should I keep my medicine? Keep out of the reach of children. Store at room temperature between 15 and 30 degrees C (59 and 86 degrees F). Throw away any unused medicine after the expiration date. NOTE: This sheet is a summary. It may not cover all possible information. If you have questions about this medicine, talk to your doctor, pharmacist, or health care provider.  2015, [employer]vier/[employer] Standard. (2008-10-20 11:46:40)

## 2015-07-27 NOTE — Progress Notes (Signed)
   patient is a 54 year old was seen the office for the first time as a new patient November 2015. Patient had moved to [facility] from [place]. Patient had been on mini valve 0.1 mg twice a week patch along with the addition of Prometrium 200 mg for 12 days of the month. She states her vasomotor symptoms had improved from now that seems to be worse and she does not like to be having a heavy period and during the time that she's taking the Prometrium. Pap smear last year was normal. She is otherwise reports no intermenstrual bleeding. She is being followed by her endocrinologist as a result of her Hashimoto's thyroiditis and had mentioned that her thyroid function tests were normal range in April of this year and her levothyroxine has been remained at 75 g daily. Patient denies any GI or GU complaints no abdominal pain no back pain. Otherwise she has been doing well.  We had a lengthy discussion of different forms of estrogen administration from transdermal patches to gels 2 vaginal rings to oral. She had also had mentioned that she was exposing decreased libido as well. Her mammogram was normal January this year. She still is not schedule her bone density study. We discussed at length of the women's health initiative study the risk benefits and pros and cons of hormone replacement therapy to include DVT and pulmonary embolism. We concluded that to help with her symptoms to eliminate the transdermal routes and start with oral Estratest 0.625 mg daily which will help with her vasomotor symptoms as well as for her libido. We are going to change from the Prometrium 200 mg for 12 days a month to Provera 10 mg for 10 days of the month. She is scheduled to see me in December for her annual exam will monitor to see how she responds to this regimen. If she has any intermenstrual bleeding she knows to contact the office so that we can proceed with further evaluation such as with an endometrial biopsy. She  will also make an appointment have her bone density study before her next annual exam this December.  Greater than 50% of the time was spent counseling and coordinate care for this patient.

## 2015-08-08 ENCOUNTER — Other Ambulatory Visit: Payer: Self-pay | Admitting: Gynecology

## 2015-08-08 DIAGNOSIS — Z13820 Encounter for screening for osteoporosis: Secondary | ICD-10-CM

## 2015-08-23 ENCOUNTER — Telehealth: Payer: Self-pay | Admitting: *Deleted

## 2015-08-23 ENCOUNTER — Other Ambulatory Visit: Payer: Self-pay

## 2015-08-23 MED ORDER — MEDROXYPROGESTERONE ACETATE 10 MG PO TABS: ORAL_TABLET | ORAL | Status: DC

## 2015-08-23 MED ORDER — ESTRADIOL 0.05 MG/24HR TD PTTW
1.0000 | MEDICATED_PATCH | TRANSDERMAL | Status: DC
Start: 2015-08-23 — End: 2015-10-19

## 2015-08-23 NOTE — Telephone Encounter (Signed)
Pt aware Rx sent.  

## 2015-08-23 NOTE — Telephone Encounter (Signed)
Tell her to discontinue the Estratest and start with minimal Vivelle transdermal patch 0.05 mg twice a day and to take the Provera 10 mg for 10 days of the month. Please call in prescription for one month of the patch with 11 refills

## 2015-08-23 NOTE — Telephone Encounter (Signed)
Pt hormone replace therapy was switched to Estratest 0.625 mg daily,Provera 10 mg for 10 days of the month on OV 07/27/15. Pt has noticed increase headaches, has migraines, typically once every month, but has noticed on new  HRT she has then almost 3-5 times a week. Pt asked could switch back to patch? Please advise

## 2015-08-29 ENCOUNTER — Ambulatory Visit (INDEPENDENT_AMBULATORY_CARE_PROVIDER_SITE_OTHER): Payer: BLUE CROSS/BLUE SHIELD

## 2015-08-29 DIAGNOSIS — Z13820 Encounter for screening for osteoporosis: Secondary | ICD-10-CM

## 2015-09-21 ENCOUNTER — Encounter: Payer: Self-pay | Admitting: Women's Health

## 2015-09-21 ENCOUNTER — Ambulatory Visit (INDEPENDENT_AMBULATORY_CARE_PROVIDER_SITE_OTHER): Payer: BLUE CROSS/BLUE SHIELD | Admitting: Women's Health

## 2015-09-21 VITALS — BP 132/80 | Ht 64.0 in | Wt 159.0 lb

## 2015-09-21 DIAGNOSIS — B373 Candidiasis of vulva and vagina: Secondary | ICD-10-CM | POA: Diagnosis not present

## 2015-09-21 DIAGNOSIS — B3731 Acute candidiasis of vulva and vagina: Secondary | ICD-10-CM

## 2015-09-21 LAB — WET PREP FOR TRICH, YEAST, CLUE
Clue Cells Wet Prep HPF POC: NONE SEEN
Trich, Wet Prep: NONE SEEN

## 2015-09-21 MED ORDER — NYSTATIN-TRIAMCINOLONE 100000-0.1 UNIT/GM-% EX OINT: 1.0000 "application " | TOPICAL_OINTMENT | Freq: Two times a day (BID) | CUTANEOUS | Status: DC

## 2015-09-21 MED ORDER — FLUCONAZOLE 150 MG PO TABS: 150.0000 mg | ORAL_TABLET | Freq: Once | ORAL | Status: DC

## 2015-09-21 NOTE — Patient Instructions (Signed)

## 2015-09-21 NOTE — Progress Notes (Signed)
Patient ID: Lisa KusterCaroline Lynn, female   DOB: October 11, 1961, 54 y.o.   MRN: [id number] Presents with complaint of external vaginal rash with itching for the past several days. Had been on 2 rounds of antibiotics for dental abscess/had tooth extracted and implant placed. Denies urinary symptoms, abdominal pain or fever Postmenopausal on cyclic HRT, continues to have monthly bleeding.  Exam: Appears well. External genitalia excoriated extending from mons pubis to rectal area. Wet prep done with a Q-tip. Wet prep positive for moderate yeast.  Yeast vaginitis  Plan: Diflucan 150 by mouth times one dose with refill, Mycolog ointment externally twice daily. Instructed to call if no relief of symptoms.

## 2015-10-19 ENCOUNTER — Ambulatory Visit (INDEPENDENT_AMBULATORY_CARE_PROVIDER_SITE_OTHER): Payer: BLUE CROSS/BLUE SHIELD | Admitting: Gynecology

## 2015-10-19 ENCOUNTER — Encounter: Payer: Self-pay | Admitting: Gynecology

## 2015-10-19 VITALS — BP 116/70 | Ht 64.5 in | Wt 159.6 lb

## 2015-10-19 DIAGNOSIS — Z79890 Hormone replacement therapy (postmenopausal): Secondary | ICD-10-CM

## 2015-10-19 DIAGNOSIS — Z01419 Encounter for gynecological examination (general) (routine) without abnormal findings: Secondary | ICD-10-CM | POA: Diagnosis not present

## 2015-10-19 MED ORDER — MEDROXYPROGESTERONE ACETATE 10 MG PO TABS: ORAL_TABLET | ORAL | Status: DC

## 2015-10-19 MED ORDER — ESTRADIOL 0.05 MG/24HR TD PTTW: 1.0000 | MEDICATED_PATCH | TRANSDERMAL | Status: DC

## 2015-10-19 MED ORDER — ZOLPIDEM TARTRATE 5 MG PO TABS: 5.0000 mg | ORAL_TABLET | Freq: Every evening | ORAL | Status: DC | PRN

## 2015-10-19 NOTE — Progress Notes (Signed)
Lisa CitizenCaroline Fosnaugh 07/22/1961 [id number]   History:    54 y.o.  for annual gyn exam with no complaints today. Her vasomotor symptoms have improved. She was seen for the first time as a new patient September 2016.[place]. Patient had been on mini valve 0.1 mg twice a week patch along with the addition of Prometrium 200 mg for 12 days of the month. We had decrease her Vivelle Dot 2.05 mg patch twice a week and she is on Provera 10 mg for 10 days of the month and is having cyclical bleeding 3-4 days.  Patient had a colonoscopy in 2013 were by benign polyps were removed. She had a normal bone density study here in our office in 2016 as well as her mammogram. Her flu vaccine is up-to-date. Patient with no previous history of any abnormal Pap smears.  She had been followed by Dr. Leslie DalesAltheimer (endocrinologist) who is treating her for Hashimoto's thyroiditis. Patient with past history of mitral valve prolapse proven by echocardiography many years ago her cardiologist had released her since she was asymptomatic.  Past medical history,surgical history, family history and social history were all reviewed and documented in the EPIC chart.  Gynecologic History No LMP recorded. Patient is postmenopausal. Contraception: post menopausal status Last Pap: 2015. Results were: normal Last mammogram: 2016. Results were: normal  Obstetric History OB History  Gravida Para Term Preterm AB SAB TAB Ectopic Multiple Living  3 2   1 1    2     # Outcome Date GA Lbr Len/2nd Weight Sex Delivery Anes PTL Lv  3 SAB           2 Para           1 Para                ROS: A ROS was performed and pertinent positives and negatives are included in the history.  GENERAL: No fevers or chills. HEENT: No change in vision, no earache, sore throat or sinus congestion. NECK: No pain or stiffness. CARDIOVASCULAR: No chest pain or pressure. No palpitations. PULMONARY: No shortness  of breath, cough or wheeze. GASTROINTESTINAL: No abdominal pain, nausea, vomiting or diarrhea, melena or bright red blood per rectum. GENITOURINARY: No urinary frequency, urgency, hesitancy or dysuria. MUSCULOSKELETAL: No joint or muscle pain, no back pain, no recent trauma. DERMATOLOGIC: No rash, no itching, no lesions. ENDOCRINE: No polyuria, polydipsia, no heat or cold intolerance. No recent change in weight. HEMATOLOGICAL: No anemia or easy bruising or bleeding. NEUROLOGIC: No headache, seizures, numbness, tingling or weakness. PSYCHIATRIC: No depression, no loss of interest in normal activity or change in sleep pattern.     Exam: chaperone present  BP 116/70 mmHg  Ht 5' 4.5" (1.638 m)  Wt 159 lb 9.6 oz (72.394 kg)  BMI 26.98 kg/m2  Body mass index is 26.98 kg/(m^2).  General appearance : Well developed well nourished female. No acute distress HEENT: Eyes: no retinal hemorrhage or exudates,  Neck supple, trachea midline, no carotid bruits, no thyroidmegaly Lungs: Clear to auscultation, no rhonchi or wheezes, or rib retractions  Heart: Regular rate and rhythm, no murmurs or gallops Breast:Examined in sitting and supine position were symmetrical in appearance, no palpable masses or tenderness,  no skin retraction, no nipple inversion, no nipple discharge, no skin discoloration, no axillary or supraclavicular lymphadenopathy Abdomen: no palpable masses or tenderness, no rebound or guarding Extremities: no edema or skin discoloration or tenderness  Pelvic:  Bartholin, Urethra, Skene Glands: Within normal limits             Vagina: No gross lesions or discharge  Cervix: No gross lesions or discharge  Uterus  anteverted, normal size, shape and consistency, non-tender and mobile  Adnexa  Without masses or tenderness  Anus and perineum  normal   Rectovaginal  normal sphincter tone without palpated masses or tenderness             Hemoccult will provide cards today     Assessment/Plan:   54 y.o. female for annual exam doing well on HRT. Patient to return back next week in a fasting state for the following screening blood work: Fasting lipid profile, comprehensive metabolic panel, TSH, CBC, and urinalysis. Patient requesting to be followed by different endocrinologist and and going to refer her to Dr. Foye Spurling. She was reminded on the importance of calcium vitamin D and regular exercise for osteoporosis prevention. Prescription refill for home replacement therapy was provided. We discussed importance of monthly self breast examination. Her flu vaccines up-to-date.   Ok Edwards MD, 4:20 PM 10/19/2015

## 2015-10-25 ENCOUNTER — Other Ambulatory Visit: Payer: BLUE CROSS/BLUE SHIELD

## 2015-10-25 DIAGNOSIS — Z01419 Encounter for gynecological examination (general) (routine) without abnormal findings: Secondary | ICD-10-CM

## 2015-10-25 LAB — CBC WITH DIFFERENTIAL/PLATELET
Basophils Absolute: 0.1 10*3/uL (ref 0.0–0.1)
Basophils Relative: 1 % (ref 0–1)
Eosinophils Absolute: 0.1 10*3/uL (ref 0.0–0.7)
Eosinophils Relative: 2 % (ref 0–5)
HCT: 39.5 % (ref 36.0–46.0)
Hemoglobin: 13.2 g/dL (ref 12.0–15.0)
Lymphocytes Relative: 30 % (ref 12–46)
Lymphs Abs: 1.8 10*3/uL (ref 0.7–4.0)
MCH: 29.6 pg (ref 26.0–34.0)
MCHC: 33.4 g/dL (ref 30.0–36.0)
MCV: 88.6 fL (ref 78.0–100.0)
MPV: 10.2 fL (ref 8.6–12.4)
Monocytes Absolute: 0.5 10*3/uL (ref 0.1–1.0)
Monocytes Relative: 9 % (ref 3–12)
Neutro Abs: 3.4 10*3/uL (ref 1.7–7.7)
Neutrophils Relative %: 58 % (ref 43–77)
Platelets: 294 10*3/uL (ref 150–400)
RBC: 4.46 MIL/uL (ref 3.87–5.11)
RDW: 13.5 % (ref 11.5–15.5)
WBC: 5.9 10*3/uL (ref 4.0–10.5)

## 2015-10-25 LAB — COMPREHENSIVE METABOLIC PANEL
ALT: 16 U/L (ref 6–29)
AST: 15 U/L (ref 10–35)
Albumin: 4.6 g/dL (ref 3.6–5.1)
Alkaline Phosphatase: 55 U/L (ref 33–130)
BUN: 13 mg/dL (ref 7–25)
CO2: 25 mmol/L (ref 20–31)
Calcium: 9.4 mg/dL (ref 8.6–10.4)
Chloride: 104 mmol/L (ref 98–110)
Creat: 0.72 mg/dL (ref 0.50–1.05)
Glucose, Bld: 88 mg/dL (ref 65–99)
Potassium: 3.9 mmol/L (ref 3.5–5.3)
Sodium: 138 mmol/L (ref 135–146)
Total Bilirubin: 0.9 mg/dL (ref 0.2–1.2)
Total Protein: 7.1 g/dL (ref 6.1–8.1)

## 2015-10-25 LAB — LIPID PANEL
Cholesterol: 205 mg/dL — ABNORMAL HIGH (ref 125–200)
HDL: 78 mg/dL (ref >=46)
LDL Cholesterol: 116 mg/dL (ref <130)
Total CHOL/HDL Ratio: 2.6 Ratio (ref <=5.0)
Triglycerides: 56 mg/dL (ref <150)
VLDL: 11 mg/dL (ref <30)

## 2015-10-25 LAB — TSH: TSH: 3.787 u[IU]/mL (ref 0.350–4.500)

## 2015-10-26 LAB — URINALYSIS W MICROSCOPIC + REFLEX CULTURE
Bacteria, UA: NONE SEEN [HPF]
Bilirubin Urine: NEGATIVE
Casts: NONE SEEN [LPF]
Crystals: NONE SEEN [HPF]
Glucose, UA: NEGATIVE
Hgb urine dipstick: NEGATIVE
Ketones, ur: NEGATIVE
Leukocytes, UA: NEGATIVE
Nitrite: NEGATIVE
Specific Gravity, Urine: 1.024 (ref 1.001–1.035)
Yeast: NONE SEEN [HPF]
pH: 8 (ref 5.0–8.0)

## 2015-10-29 LAB — URINE CULTURE: Colony Count: >=100000

## 2015-10-30 ENCOUNTER — Other Ambulatory Visit: Payer: Self-pay | Admitting: Gynecology

## 2015-10-30 MED ORDER — NITROFURANTOIN MONOHYD MACRO 100 MG PO CAPS: 100.0000 mg | ORAL_CAPSULE | Freq: Two times a day (BID) | ORAL | Status: DC

## 2015-11-24 ENCOUNTER — Telehealth: Payer: Self-pay | Admitting: *Deleted

## 2015-11-24 ENCOUNTER — Other Ambulatory Visit: Payer: BLUE CROSS/BLUE SHIELD

## 2015-11-24 DIAGNOSIS — N390 Urinary tract infection, site not specified: Secondary | ICD-10-CM

## 2015-11-24 NOTE — Telephone Encounter (Signed)
Pt called requesting repeat u/a to confirm infection from 10/25/15 result note is gone. Order placed

## 2015-11-25 LAB — URINALYSIS W MICROSCOPIC + REFLEX CULTURE
Bacteria, UA: NONE SEEN [HPF]
Bilirubin Urine: NEGATIVE
Casts: NONE SEEN [LPF]
Crystals: NONE SEEN [HPF]
Glucose, UA: NEGATIVE
Hgb urine dipstick: NEGATIVE
Ketones, ur: NEGATIVE
Leukocytes, UA: NEGATIVE
Nitrite: NEGATIVE
Protein, ur: NEGATIVE
Specific Gravity, Urine: 1.014 (ref 1.001–1.035)
Squamous Epithelial / LPF: NONE SEEN [HPF] (ref <=5)
WBC, UA: NONE SEEN WBC/HPF (ref <=5)
Yeast: NONE SEEN [HPF]
pH: 6 (ref 5.0–8.0)

## 2015-11-26 LAB — URINE CULTURE
Colony Count: NO GROWTH
Organism ID, Bacteria: NO GROWTH

## 2015-11-27 ENCOUNTER — Telehealth: Payer: Self-pay

## 2015-11-27 NOTE — Telephone Encounter (Signed)
Patient called for urine results. Informed. U/a and urine culture were negative.

## 2016-01-16 ENCOUNTER — Telehealth: Payer: Self-pay

## 2016-01-16 NOTE — Telephone Encounter (Signed)
Patient said she is on estrogen patch 0.05mg .  She said it is not controlling her hot flashes especially at night.  She used to be on the 0.1 mg dose when she first started seeing you. She would like to see if you think okay for her to go back on that because it seemed to control her hot flashes well.

## 2016-01-17 ENCOUNTER — Other Ambulatory Visit: Payer: Self-pay | Admitting: Gynecology

## 2016-01-17 MED ORDER — ESTRADIOL 0.1 MG/24HR TD PTTW: 1.0000 | MEDICATED_PATCH | TRANSDERMAL | Status: DC

## 2016-01-17 NOTE — Telephone Encounter (Signed)
Patient informed okay. Rx sent.

## 2016-01-17 NOTE — Telephone Encounter (Signed)
Please call in  0.1 mg Vivelle dot to apply twice a week. #  8 with refill x 11

## 2016-01-29 ENCOUNTER — Other Ambulatory Visit: Payer: Self-pay

## 2016-01-29 DIAGNOSIS — Z1231 Encounter for screening mammogram for malignant neoplasm of breast: Secondary | ICD-10-CM

## 2016-01-30 ENCOUNTER — Other Ambulatory Visit: Payer: Self-pay

## 2016-01-30 MED ORDER — ESTRADIOL 0.1 MG/24HR TD PTTW: 1.0000 | MEDICATED_PATCH | TRANSDERMAL | Status: DC

## 2016-02-27 ENCOUNTER — Ambulatory Visit: Payer: BLUE CROSS/BLUE SHIELD

## 2016-03-14 ENCOUNTER — Ambulatory Visit
Admission: RE | Admit: 2016-03-14 | Discharge: 2016-03-14 | Disposition: A | Discharge status: Home / Self Care | Payer: BLUE CROSS/BLUE SHIELD | Source: Ambulatory Visit

## 2016-03-14 DIAGNOSIS — Z1231 Encounter for screening mammogram for malignant neoplasm of breast: Secondary | ICD-10-CM | POA: Diagnosis not present

## 2016-03-27 ENCOUNTER — Telehealth: Payer: Self-pay | Admitting: *Deleted

## 2016-03-27 NOTE — Telephone Encounter (Signed)
Left message for pt to call.

## 2016-03-27 NOTE — Telephone Encounter (Signed)
Take the progesterone along with the estrogen daily increase the risk of breast cancer. May want to take the Provera for 5 days a month instead of 10 instead

## 2016-03-27 NOTE — Telephone Encounter (Signed)
Pt takes estradiol patch 0.1 mg and provera 10 mg tablet day 1-10 monthly. Pt called c/o heavy bleeding starting day 8 of taking the provera and it lasting 5/6 days. Pt would like to start taking progesterone daily. Please advise

## 2016-03-29 NOTE — Telephone Encounter (Signed)
Pt informed with the below note. 

## 2016-04-23 DIAGNOSIS — Z0000 Encounter for general adult medical examination without abnormal findings: Secondary | ICD-10-CM | POA: Diagnosis not present

## 2016-04-23 DIAGNOSIS — Z136 Encounter for screening for cardiovascular disorders: Secondary | ICD-10-CM | POA: Diagnosis not present

## 2016-04-25 DIAGNOSIS — Z0000 Encounter for general adult medical examination without abnormal findings: Secondary | ICD-10-CM | POA: Diagnosis not present

## 2016-04-25 DIAGNOSIS — Z23 Encounter for immunization: Secondary | ICD-10-CM | POA: Diagnosis not present

## 2016-05-10 DIAGNOSIS — R5383 Other fatigue: Secondary | ICD-10-CM | POA: Diagnosis not present

## 2016-05-10 DIAGNOSIS — F331 Major depressive disorder, recurrent, moderate: Secondary | ICD-10-CM | POA: Diagnosis not present

## 2016-05-10 DIAGNOSIS — F411 Generalized anxiety disorder: Secondary | ICD-10-CM | POA: Diagnosis not present

## 2016-06-04 ENCOUNTER — Ambulatory Visit: Payer: BLUE CROSS/BLUE SHIELD | Admitting: Internal Medicine

## 2016-06-19 DIAGNOSIS — F331 Major depressive disorder, recurrent, moderate: Secondary | ICD-10-CM | POA: Diagnosis not present

## 2016-06-19 DIAGNOSIS — F411 Generalized anxiety disorder: Secondary | ICD-10-CM | POA: Diagnosis not present

## 2016-06-19 DIAGNOSIS — Z6826 Body mass index (BMI) 26.0-26.9, adult: Secondary | ICD-10-CM | POA: Diagnosis not present

## 2016-07-02 DIAGNOSIS — F411 Generalized anxiety disorder: Secondary | ICD-10-CM | POA: Diagnosis not present

## 2016-07-02 DIAGNOSIS — Z23 Encounter for immunization: Secondary | ICD-10-CM | POA: Diagnosis not present

## 2016-07-02 DIAGNOSIS — G4700 Insomnia, unspecified: Secondary | ICD-10-CM | POA: Diagnosis not present

## 2016-07-08 ENCOUNTER — Ambulatory Visit (INDEPENDENT_AMBULATORY_CARE_PROVIDER_SITE_OTHER): Payer: BLUE CROSS/BLUE SHIELD | Admitting: Licensed Clinical Social Worker

## 2016-07-08 DIAGNOSIS — F331 Major depressive disorder, recurrent, moderate: Secondary | ICD-10-CM | POA: Diagnosis not present

## 2016-07-16 DIAGNOSIS — Z1211 Encounter for screening for malignant neoplasm of colon: Secondary | ICD-10-CM | POA: Diagnosis not present

## 2016-07-16 DIAGNOSIS — K5730 Diverticulosis of large intestine without perforation or abscess without bleeding: Secondary | ICD-10-CM | POA: Diagnosis not present

## 2016-07-24 ENCOUNTER — Ambulatory Visit (INDEPENDENT_AMBULATORY_CARE_PROVIDER_SITE_OTHER): Payer: BLUE CROSS/BLUE SHIELD | Admitting: Licensed Clinical Social Worker

## 2016-07-24 DIAGNOSIS — F331 Major depressive disorder, recurrent, moderate: Secondary | ICD-10-CM

## 2016-07-30 ENCOUNTER — Ambulatory Visit (INDEPENDENT_AMBULATORY_CARE_PROVIDER_SITE_OTHER): Payer: BLUE CROSS/BLUE SHIELD | Admitting: Licensed Clinical Social Worker

## 2016-07-30 DIAGNOSIS — F331 Major depressive disorder, recurrent, moderate: Secondary | ICD-10-CM | POA: Diagnosis not present

## 2016-08-07 LAB — HM COLONOSCOPY

## 2016-08-16 ENCOUNTER — Ambulatory Visit: Payer: BLUE CROSS/BLUE SHIELD | Admitting: Licensed Clinical Social Worker

## 2016-08-29 ENCOUNTER — Ambulatory Visit: Payer: BLUE CROSS/BLUE SHIELD | Admitting: Licensed Clinical Social Worker

## 2016-10-21 ENCOUNTER — Ambulatory Visit (INDEPENDENT_AMBULATORY_CARE_PROVIDER_SITE_OTHER): Payer: BLUE CROSS/BLUE SHIELD | Admitting: Gynecology

## 2016-10-21 ENCOUNTER — Encounter: Payer: Self-pay | Admitting: Gynecology

## 2016-10-21 VITALS — BP 132/74 | Ht 64.0 in | Wt 160.4 lb

## 2016-10-21 DIAGNOSIS — Z01419 Encounter for gynecological examination (general) (routine) without abnormal findings: Secondary | ICD-10-CM

## 2016-10-21 MED ORDER — MEDROXYPROGESTERONE ACETATE 10 MG PO TABS: ORAL_TABLET | ORAL | 3 refills | Status: DC

## 2016-10-21 MED ORDER — ESTRADIOL 0.1 MG/24HR TD PTTW: 1.0000 | MEDICATED_PATCH | TRANSDERMAL | 3 refills | Status: DC

## 2016-10-21 NOTE — Progress Notes (Signed)
Lisa CitizenCaroline Emerick 1960-12-22 [id number]   History:    55 y.o.  for annual gyn exam with no complaints today. She has been doing well On her hormone replacement therapy consisting of Vivelle Dot 0.1 mg/24-hour patch which she applies twice a week with the addition of Provera 10 mg for 10 days of the month and she bleeds for 3-4 days. No intermenstrual bleeding reported. Patient had a colonoscopy in 2013 benign polyps were removed she is on a 5 year recall. Patient with no previous history of any abnormal Pap smear. She had a normal bone density study in 2016. Her PCP has been doing her blood work and her vaccines are up-to-date.  Past medical history,surgical history, family history and social history were all reviewed and documented in the EPIC chart.  Gynecologic History No LMP recorded. Patient is postmenopausal. Contraception: post menopausal status Last Pap: 2015. Results were: normal Last mammogram: 2017. Results were: normal  Obstetric History OB History  Gravida Para Term Preterm AB Living  3 2     1 2   SAB TAB Ectopic Multiple Live Births  1            # Outcome Date GA Lbr Len/2nd Weight Sex Delivery Anes PTL Lv  3 SAB           2 Para           1 Para                ROS: A ROS was performed and pertinent positives and negatives are included in the history.  GENERAL: No fevers or chills. HEENT: No change in vision, no earache, sore throat or sinus congestion. NECK: No pain or stiffness. CARDIOVASCULAR: No chest pain or pressure. No palpitations. PULMONARY: No shortness of breath, cough or wheeze. GASTROINTESTINAL: No abdominal pain, nausea, vomiting or diarrhea, melena or bright red blood per rectum. GENITOURINARY: No urinary frequency, urgency, hesitancy or dysuria. MUSCULOSKELETAL: No joint or muscle pain, no back pain, no recent trauma. DERMATOLOGIC: No rash, no itching, no lesions. ENDOCRINE: No polyuria, polydipsia, no heat or cold intolerance. No recent change in  weight. HEMATOLOGICAL: No anemia or easy bruising or bleeding. NEUROLOGIC: No headache, seizures, numbness, tingling or weakness. PSYCHIATRIC: No depression, no loss of interest in normal activity or change in sleep pattern.     Exam: chaperone present  BP 132/74   Ht 5\' 4"  (1.626 m)   Wt 160 lb 6.4 oz (72.8 kg)   BMI 27.53 kg/m   Body mass index is 27.53 kg/m.  General appearance : Well developed well nourished female. No acute distress HEENT: Eyes: no retinal hemorrhage or exudates,  Neck supple, trachea midline, no carotid bruits, no thyroidmegaly Lungs: Clear to auscultation, no rhonchi or wheezes, or rib retractions  Heart: Regular rate and rhythm, no murmurs or gallops Breast:Examined in sitting and supine position were symmetrical in appearance, no palpable masses or tenderness,  no skin retraction, no nipple inversion, no nipple discharge, no skin discoloration, no axillary or supraclavicular lymphadenopathy Abdomen: no palpable masses or tenderness, no rebound or guarding Extremities: no edema or skin discoloration or tenderness  Pelvic:  Bartholin, Urethra, Skene Glands: Within normal limits             Vagina: No gross lesions or discharge  Cervix: No gross lesions or discharge  Uterus  anteverted, normal size, shape and consistency, non-tender and mobile  Adnexa  Without masses or tenderness  Anus and perineum  normal  Rectovaginal  normal sphincter tone without palpated masses or tenderness             Hemoccult PCP provides     Assessment/Plan:  55 y.o. female for annual exam postmenopausal patient doing well on hormone replacement therapy. Patient taking her calcium vitamin D for osteoporosis prevention along with regular exercise. She will need a follow-up bone density study next year. Pap smear not done this year patient will need Pap smear next year. Blood work and vaccines provided by her PCP.   Ok EdwardsFERNANDEZ,JUAN H MD, 4:05 PM 10/21/2016

## 2016-10-29 DIAGNOSIS — Z23 Encounter for immunization: Secondary | ICD-10-CM | POA: Diagnosis not present

## 2016-10-29 DIAGNOSIS — F329 Major depressive disorder, single episode, unspecified: Secondary | ICD-10-CM | POA: Diagnosis not present

## 2016-10-29 DIAGNOSIS — Z79890 Hormone replacement therapy (postmenopausal): Secondary | ICD-10-CM | POA: Diagnosis not present

## 2016-10-29 DIAGNOSIS — F411 Generalized anxiety disorder: Secondary | ICD-10-CM | POA: Diagnosis not present

## 2016-10-29 DIAGNOSIS — Z6826 Body mass index (BMI) 26.0-26.9, adult: Secondary | ICD-10-CM | POA: Diagnosis not present

## 2016-12-03 DIAGNOSIS — Z79890 Hormone replacement therapy (postmenopausal): Secondary | ICD-10-CM | POA: Diagnosis not present

## 2016-12-03 DIAGNOSIS — G43109 Migraine with aura, not intractable, without status migrainosus: Secondary | ICD-10-CM | POA: Diagnosis not present

## 2016-12-03 DIAGNOSIS — G4700 Insomnia, unspecified: Secondary | ICD-10-CM | POA: Diagnosis not present

## 2016-12-03 DIAGNOSIS — Z6826 Body mass index (BMI) 26.0-26.9, adult: Secondary | ICD-10-CM | POA: Diagnosis not present

## 2016-12-27 ENCOUNTER — Ambulatory Visit (INDEPENDENT_AMBULATORY_CARE_PROVIDER_SITE_OTHER): Payer: BLUE CROSS/BLUE SHIELD | Admitting: Gynecology

## 2016-12-27 ENCOUNTER — Encounter: Payer: Self-pay | Admitting: Gynecology

## 2016-12-27 ENCOUNTER — Other Ambulatory Visit: Payer: Self-pay | Admitting: Gynecology

## 2016-12-27 VITALS — BP 124/80 | Ht 64.0 in | Wt 160.0 lb

## 2016-12-27 DIAGNOSIS — R350 Frequency of micturition: Secondary | ICD-10-CM

## 2016-12-27 DIAGNOSIS — N950 Postmenopausal bleeding: Secondary | ICD-10-CM

## 2016-12-27 DIAGNOSIS — N3000 Acute cystitis without hematuria: Secondary | ICD-10-CM | POA: Diagnosis not present

## 2016-12-27 LAB — URINALYSIS W MICROSCOPIC + REFLEX CULTURE
Casts: NONE SEEN [LPF]
Crystals: NONE SEEN [HPF]
Yeast: NONE SEEN [HPF]

## 2016-12-27 MED ORDER — DIAZEPAM 5 MG PO TABS: 5.0000 mg | ORAL_TABLET | Freq: Four times a day (QID) | ORAL | 0 refills | Status: DC | PRN

## 2016-12-27 MED ORDER — PHENAZOPYRIDINE HCL 200 MG PO TABS: 200.0000 mg | ORAL_TABLET | Freq: Three times a day (TID) | ORAL | 0 refills | Status: DC | PRN

## 2016-12-27 MED ORDER — NITROFURANTOIN MONOHYD MACRO 100 MG PO CAPS
100.0000 mg | ORAL_CAPSULE | Freq: Two times a day (BID) | ORAL | 0 refills | Status: DC
Start: 2016-12-27 — End: 2020-09-27

## 2016-12-27 NOTE — Patient Instructions (Signed)
Nitrofurantoin tablets or capsules What is this medicine? NITROFURANTOIN (nye troe fyoor AN toyn) is an antibiotic. It is used to treat urinary tract infections. This medicine may be used for other purposes; ask your health care provider or pharmacist if you have questions. COMMON BRAND NAME(S): Macrobid, Macrodantin, Urotoin What should I tell my health care provider before I take this medicine? They need to know if you have any of these conditions: -anemia -diabetes -glucose-6-phosphate dehydrogenase deficiency -kidney disease -liver disease -lung disease -other chronic illness -an unusual or allergic reaction to nitrofurantoin, other antibiotics, other medicines, foods, dyes or preservatives -pregnant or trying to get pregnant -breast-feeding How should I use this medicine? Take this medicine by mouth with a glass of water. Follow the directions on the prescription label. Take this medicine with food or milk. Take your doses at regular intervals. Do not take your medicine more often than directed. Do not stop taking except on your doctor's advice. Talk to your pediatrician regarding the use of this medicine in children. While this drug may be prescribed for selected conditions, precautions do apply. Overdosage: If you think you have taken too much of this medicine contact a poison control center or emergency room at once. NOTE: This medicine is only for you. Do not share this medicine with others. What if I miss a dose? If you miss a dose, take it as soon as you can. If it is almost time for your next dose, take only that dose. Do not take double or extra doses. What may interact with this medicine? -antacids containing magnesium trisilicate -probenecid -quinolone antibiotics like ciprofloxacin, lomefloxacin, norfloxacin and ofloxacin -sulfinpyrazone This list may not describe all possible interactions. Give your health care provider a list of all the medicines, herbs,  non-prescription drugs, or dietary supplements you use. Also tell them if you smoke, drink alcohol, or use illegal drugs. Some items may interact with your medicine. What should I watch for while using this medicine? Tell your doctor or health care professional if your symptoms do not improve or if you get new symptoms. Drink several glasses of water a day. If you are taking this medicine for a long time, visit your doctor for regular checks on your progress. If you are diabetic, you may get a false positive result for sugar in your urine with certain brands of urine tests. Check with your doctor. What side effects may I notice from receiving this medicine? Side effects that you should report to your doctor or health care professional as soon as possible: -allergic reactions like skin rash or hives, swelling of the face, lips, or tongue -chest pain -cough -difficulty breathing -dizziness, drowsiness -fever or infection -joint aches or pains -pale or blue-tinted skin -redness, blistering, peeling or loosening of the skin, including inside the mouth -tingling, burning, pain, or numbness in hands or feet -unusual bleeding or bruising -unusually weak or tired -yellowing of eyes or skin Side effects that usually do not require medical attention (report to your doctor or health care professional if they continue or are bothersome): -dark urine -diarrhea -headache -loss of appetite -nausea or vomiting -temporary hair loss This list may not describe all possible side effects. Call your doctor for medical advice about side effects. You may report side effects to FDA at 1-800-FDA-1088. Where should I keep my medicine? Keep out of the reach of children. Store at room temperature between 15 and 30 degrees C (59 and 86 degrees F). Protect from light. Throw away any unused   medicine after the expiration date. NOTE: This sheet is a summary. It may not cover all possible information. If you have  questions about this medicine, talk to your doctor, pharmacist, or health care provider.  2017 Elsevier/Gold Standard (2008-05-25 15:56:47) Urinary Frequency The number of times a normal person urinates depends upon how much liquid they take in and how much liquid they are losing. If the temperature is hot and there is high humidity, then the person will sweat more and usually breathe a little more frequently. These factors decrease the amount of frequency of urination that would be considered normal. The amount you drink is easily determined, but the amount of fluid lost is sometimes more difficult to calculate.  Fluid is lost in two ways:  Sensible fluid loss is usually measured by the amount of urine that you get rid of. Losses of fluid can also occur with diarrhea.  Insensible fluid loss is more difficult to measure. It is caused by evaporation. Insensible loss of fluid occurs through breathing and sweating. It usually ranges from a little less than a quart to a little more than a quart of fluid a day. In normal temperatures and activity levels, the average person may urinate 4 to 7 times in a 24-hour period. Needing to urinate more often than that could indicate a problem. If one urinates 4 to 7 times in 24 hours and has large volumes each time, that could indicate a different problem from one who urinates 4 to 7 times a day and has small volumes. The time of urinating is also important. Most urinating should be done during the waking hours. Getting up at night to urinate frequently can indicate some problems. CAUSES  The bladder is the organ in your lower abdomen that holds urine. Like a balloon, it swells some as it fills up. Your nerves sense this and tell you it is time to head for the bathroom. There are a number of reasons that you might feel the need to urinate more often than usual. They include:  Urinary tract infection. This is usually associated with other signs such as burning when  you urinate.  In men, problems with the prostate (a walnut-size gland that is located near the tube that carries urine out of your body). There are two reasons why the prostate can cause an increased frequency of urination:  An enlarged prostate that does not let the bladder empty well. If the bladder only half empties when you urinate, then it only has half the capacity to fill before you have to urinate again.  The nerves in the bladder become more hypersensitive with an increased size of the prostate even if the bladder empties completely.  Pregnancy.  Obesity. Excess weight is more likely to cause a problem for women than for men.  Bladder stones or other bladder problems.  Caffeine.  Alcohol.  Medications. For example, drugs that help the body get rid of extra fluid (diuretics) increase urine production. Some other medicines must be taken with lots of fluids.  Muscle or nerve weakness. This might be the result of a spinal cord injury, a stroke, multiple sclerosis, or Parkinson disease.  Long-standing diabetes can decrease the sensation of the bladder. This loss of sensation makes it harder to sense the bladder needs to be emptied. Over a period of years, the bladder is stretched out by constant overfilling. This weakens the bladder muscles so that the bladder does not empty well and has less capacity to fill with   new urine.  Interstitial cystitis (also called painful bladder syndrome). This condition develops because the tissues that line the inside of the bladder are inflamed (inflammation is the body's way of reacting to injury or infection). It causes pain and frequent urination. It occurs in women more often than in men. DIAGNOSIS   To decide what might be causing your urinary frequency, your health care provider will probably:  Ask about symptoms you have noticed.  Ask about your overall health. This will include questions about any medications you are taking.  Do a  physical examination.  Order some tests. These might include:  A blood test to check for diabetes or other health issues that could be contributing to the problem.  Urine testing. This could measure the flow of urine and the pressure on the bladder.  A test of your neurological system (the brain, spinal cord, and nerves). This is the system that senses the need to urinate.  A bladder test to check whether it is emptying completely when you urinate.  Cystoscopy. This test uses a thin tube with a tiny camera on it. It offers a look inside your urethra and bladder to see if there are problems.  Imaging tests. You might be given a contrast dye and then asked to urinate. X-rays are taken to see how your bladder is working. TREATMENT  It is important for you to be evaluated to determine if the amount or frequency that you have is unusual or abnormal. If it is found to be abnormal, the cause should be determined and this can usually be found out easily. Depending upon the cause, treatment could include medication, stimulation of the nerves, or surgery. There are not too many things that you can do as an individual to change your urinary frequency. It is important that you balance the amount of fluid intake needed to compensate for your activity and the temperature. Medical problems will be diagnosed and taken care of by your physician. There is no particular bladder training such as Kegel exercises that you can do to help urinary frequency. This is an exercise that is usually recommended for people who have leaking of urine when they laugh, cough, or sneeze. HOME CARE INSTRUCTIONS   Take any medications your health care provider prescribed or suggested. Follow the directions carefully.  Practice any lifestyle changes that are recommended. These might include:  Drinking less fluid or drinking at different times of the day. If you need to urinate often during the night, for example, you may need to  stop drinking fluids early in the evening.  Cutting down on caffeine or alcohol. They both can make you need to urinate more often than normal. Caffeine is found in coffee, tea, and sodas.  Losing weight, if that is recommended.  Keep a journal or a log. You might be asked to record how much you drink and when and where you feel the need to urinate. This will also help evaluate how well the treatment provided by your physician is working. SEEK MEDICAL CARE IF:   Your need to urinate often gets worse.  You feel increased pain or irritation when you urinate.  You notice blood in your urine.  You have questions about any medications that your health care provider recommended.  You notice blood, pus, or swelling at the site of any test or treatment procedure.  You develop a fever of more than 100.5F (38.1C). SEEK IMMEDIATE MEDICAL CARE IF:  You develop a fever of more   than 102.0F (38.9C). This information is not intended to replace advice given to you by your health care provider. Make sure you discuss any questions you have with your health care provider. Document Released: 08/31/2009 Document Revised: 11/25/2014 Document Reviewed: 05/31/2015 Elsevier Interactive Patient Education  2017 Elsevier Inc.  

## 2016-12-27 NOTE — Progress Notes (Signed)
   Patient is a 56 year old presented to the office today stating that for the past day she is complaining of increased urinary frequency but not emptying completely. Some dysuria was reported. No fever, chills, nausea, vomiting or any back pain. Patient has informed past several months she's had breakthrough bleeding. Review of her record indicated that she had been on Vivelle-Dot 0.1 mg/24 hour patch that she was applying twice a week with the addition of Provera 10 mg for 10 days of the month. She states that her primary care physician has begun to taper her down and she's on the half that dose.  Exam: Exam: Gen. appearance well-developed well-nourished female in no acute distress Back: No CVA tenderness Abdomen: Soft some mild suprapubic tenderness Pelvic exam not done Rectal exam not done  Urinalysis packed white blood cell, 40-60 RBC and many bacteria  Assessment/plan: Patient with clinical evidence of cystitis will be treated with Macrobid one by mouth twice a day for 7 days with the addition of Pyridium 200 mg 3 times a day for 3 days. Patient will return to the office in 2 weeks for sonohysterogram and endometrial biopsy as part of evaluation for this postmenopausal irregular bleeding.

## 2016-12-29 LAB — URINE CULTURE

## 2016-12-30 ENCOUNTER — Other Ambulatory Visit: Payer: Self-pay | Admitting: *Deleted

## 2016-12-30 ENCOUNTER — Other Ambulatory Visit: Payer: Self-pay | Admitting: Gynecology

## 2016-12-30 DIAGNOSIS — N950 Postmenopausal bleeding: Secondary | ICD-10-CM

## 2017-01-08 ENCOUNTER — Other Ambulatory Visit: Payer: BLUE CROSS/BLUE SHIELD

## 2017-01-08 ENCOUNTER — Ambulatory Visit: Payer: BLUE CROSS/BLUE SHIELD | Admitting: Gynecology

## 2017-01-29 DIAGNOSIS — Z6827 Body mass index (BMI) 27.0-27.9, adult: Secondary | ICD-10-CM | POA: Diagnosis not present

## 2017-01-29 DIAGNOSIS — J309 Allergic rhinitis, unspecified: Secondary | ICD-10-CM | POA: Diagnosis not present

## 2017-01-29 DIAGNOSIS — R319 Hematuria, unspecified: Secondary | ICD-10-CM | POA: Diagnosis not present

## 2017-01-29 DIAGNOSIS — N390 Urinary tract infection, site not specified: Secondary | ICD-10-CM | POA: Diagnosis not present

## 2017-01-29 DIAGNOSIS — R5383 Other fatigue: Secondary | ICD-10-CM | POA: Diagnosis not present

## 2017-01-29 DIAGNOSIS — E039 Hypothyroidism, unspecified: Secondary | ICD-10-CM | POA: Diagnosis not present

## 2017-04-02 ENCOUNTER — Encounter: Payer: Self-pay | Admitting: Gynecology

## 2017-04-04 ENCOUNTER — Other Ambulatory Visit: Payer: Self-pay | Admitting: Gynecology

## 2017-04-04 DIAGNOSIS — Z1231 Encounter for screening mammogram for malignant neoplasm of breast: Secondary | ICD-10-CM

## 2017-04-25 ENCOUNTER — Ambulatory Visit: Payer: Self-pay

## 2017-04-28 DIAGNOSIS — Z13220 Encounter for screening for lipoid disorders: Secondary | ICD-10-CM | POA: Diagnosis not present

## 2017-04-28 DIAGNOSIS — Z0000 Encounter for general adult medical examination without abnormal findings: Secondary | ICD-10-CM | POA: Diagnosis not present

## 2017-05-01 DIAGNOSIS — Z09 Encounter for follow-up examination after completed treatment for conditions other than malignant neoplasm: Secondary | ICD-10-CM | POA: Diagnosis not present

## 2017-05-01 DIAGNOSIS — L578 Other skin changes due to chronic exposure to nonionizing radiation: Secondary | ICD-10-CM | POA: Diagnosis not present

## 2017-05-01 DIAGNOSIS — Z872 Personal history of diseases of the skin and subcutaneous tissue: Secondary | ICD-10-CM | POA: Diagnosis not present

## 2017-05-01 DIAGNOSIS — L308 Other specified dermatitis: Secondary | ICD-10-CM | POA: Diagnosis not present

## 2017-05-05 ENCOUNTER — Ambulatory Visit
Admission: RE | Admit: 2017-05-05 | Discharge: 2017-05-05 | Disposition: A | Discharge status: Home / Self Care | Payer: BLUE CROSS/BLUE SHIELD | Source: Ambulatory Visit | Attending: Gynecology | Admitting: Gynecology

## 2017-05-05 DIAGNOSIS — Z1231 Encounter for screening mammogram for malignant neoplasm of breast: Secondary | ICD-10-CM | POA: Diagnosis not present

## 2017-05-27 DIAGNOSIS — Z79890 Hormone replacement therapy (postmenopausal): Secondary | ICD-10-CM | POA: Diagnosis not present

## 2017-05-27 DIAGNOSIS — Z0000 Encounter for general adult medical examination without abnormal findings: Secondary | ICD-10-CM | POA: Diagnosis not present

## 2017-05-27 DIAGNOSIS — E782 Mixed hyperlipidemia: Secondary | ICD-10-CM | POA: Diagnosis not present

## 2017-05-27 DIAGNOSIS — E785 Hyperlipidemia, unspecified: Secondary | ICD-10-CM | POA: Diagnosis not present

## 2017-05-27 DIAGNOSIS — Z01419 Encounter for gynecological examination (general) (routine) without abnormal findings: Secondary | ICD-10-CM | POA: Diagnosis not present

## 2017-05-27 DIAGNOSIS — E039 Hypothyroidism, unspecified: Secondary | ICD-10-CM | POA: Diagnosis not present

## 2017-05-27 DIAGNOSIS — N760 Acute vaginitis: Secondary | ICD-10-CM | POA: Diagnosis not present

## 2017-05-27 DIAGNOSIS — Z6826 Body mass index (BMI) 26.0-26.9, adult: Secondary | ICD-10-CM | POA: Diagnosis not present

## 2017-05-27 DIAGNOSIS — Z118 Encounter for screening for other infectious and parasitic diseases: Secondary | ICD-10-CM | POA: Diagnosis not present

## 2017-05-27 DIAGNOSIS — Z1211 Encounter for screening for malignant neoplasm of colon: Secondary | ICD-10-CM | POA: Diagnosis not present

## 2017-07-08 DIAGNOSIS — D510 Vitamin B12 deficiency anemia due to intrinsic factor deficiency: Secondary | ICD-10-CM | POA: Diagnosis not present

## 2017-07-08 DIAGNOSIS — E569 Vitamin deficiency, unspecified: Secondary | ICD-10-CM | POA: Diagnosis not present

## 2017-07-08 DIAGNOSIS — E039 Hypothyroidism, unspecified: Secondary | ICD-10-CM | POA: Diagnosis not present

## 2017-07-08 DIAGNOSIS — E559 Vitamin D deficiency, unspecified: Secondary | ICD-10-CM | POA: Diagnosis not present

## 2017-09-19 DIAGNOSIS — E039 Hypothyroidism, unspecified: Secondary | ICD-10-CM | POA: Diagnosis not present

## 2017-09-19 DIAGNOSIS — N951 Menopausal and female climacteric states: Secondary | ICD-10-CM | POA: Diagnosis not present

## 2017-10-22 DIAGNOSIS — N390 Urinary tract infection, site not specified: Secondary | ICD-10-CM | POA: Diagnosis not present

## 2017-10-22 DIAGNOSIS — Z23 Encounter for immunization: Secondary | ICD-10-CM | POA: Diagnosis not present

## 2018-02-17 DIAGNOSIS — Z6825 Body mass index (BMI) 25.0-25.9, adult: Secondary | ICD-10-CM | POA: Diagnosis not present

## 2018-02-17 DIAGNOSIS — S76011A Strain of muscle, fascia and tendon of right hip, initial encounter: Secondary | ICD-10-CM | POA: Diagnosis not present

## 2018-02-17 DIAGNOSIS — M7711 Lateral epicondylitis, right elbow: Secondary | ICD-10-CM | POA: Diagnosis not present

## 2018-05-06 ENCOUNTER — Other Ambulatory Visit: Payer: Self-pay | Admitting: Family Medicine

## 2018-05-06 DIAGNOSIS — Z1231 Encounter for screening mammogram for malignant neoplasm of breast: Secondary | ICD-10-CM

## 2018-06-05 ENCOUNTER — Ambulatory Visit: Payer: Self-pay

## 2018-06-24 ENCOUNTER — Ambulatory Visit
Admission: RE | Admit: 2018-06-24 | Discharge: 2018-06-24 | Disposition: A | Discharge status: Home / Self Care | Payer: BLUE CROSS/BLUE SHIELD | Source: Ambulatory Visit | Attending: Family Medicine | Admitting: Family Medicine

## 2018-06-24 DIAGNOSIS — Z1231 Encounter for screening mammogram for malignant neoplasm of breast: Secondary | ICD-10-CM | POA: Diagnosis not present

## 2018-06-30 DIAGNOSIS — N951 Menopausal and female climacteric states: Secondary | ICD-10-CM | POA: Diagnosis not present

## 2018-06-30 DIAGNOSIS — E559 Vitamin D deficiency, unspecified: Secondary | ICD-10-CM | POA: Diagnosis not present

## 2018-06-30 DIAGNOSIS — E039 Hypothyroidism, unspecified: Secondary | ICD-10-CM | POA: Diagnosis not present

## 2018-06-30 DIAGNOSIS — I87393 Chronic venous hypertension (idiopathic) with other complications of bilateral lower extremity: Secondary | ICD-10-CM | POA: Diagnosis not present

## 2018-06-30 DIAGNOSIS — R5383 Other fatigue: Secondary | ICD-10-CM | POA: Diagnosis not present

## 2018-07-06 DIAGNOSIS — M545 Low back pain: Secondary | ICD-10-CM | POA: Diagnosis not present

## 2018-08-04 DIAGNOSIS — M545 Low back pain: Secondary | ICD-10-CM | POA: Diagnosis not present

## 2018-08-04 DIAGNOSIS — M4807 Spinal stenosis, lumbosacral region: Secondary | ICD-10-CM | POA: Diagnosis not present

## 2018-08-04 DIAGNOSIS — M5127 Other intervertebral disc displacement, lumbosacral region: Secondary | ICD-10-CM | POA: Diagnosis not present

## 2018-08-17 DIAGNOSIS — M545 Low back pain: Secondary | ICD-10-CM | POA: Diagnosis not present

## 2018-08-17 DIAGNOSIS — E039 Hypothyroidism, unspecified: Secondary | ICD-10-CM | POA: Diagnosis not present

## 2018-08-31 DIAGNOSIS — Z6826 Body mass index (BMI) 26.0-26.9, adult: Secondary | ICD-10-CM | POA: Diagnosis not present

## 2018-08-31 DIAGNOSIS — F411 Generalized anxiety disorder: Secondary | ICD-10-CM | POA: Diagnosis not present

## 2018-08-31 DIAGNOSIS — F331 Major depressive disorder, recurrent, moderate: Secondary | ICD-10-CM | POA: Diagnosis not present

## 2018-08-31 DIAGNOSIS — G4700 Insomnia, unspecified: Secondary | ICD-10-CM | POA: Diagnosis not present

## 2018-08-31 DIAGNOSIS — Z23 Encounter for immunization: Secondary | ICD-10-CM | POA: Diagnosis not present

## 2018-09-17 DIAGNOSIS — F411 Generalized anxiety disorder: Secondary | ICD-10-CM | POA: Diagnosis not present

## 2018-09-17 DIAGNOSIS — Z6827 Body mass index (BMI) 27.0-27.9, adult: Secondary | ICD-10-CM | POA: Diagnosis not present

## 2018-09-17 DIAGNOSIS — R5383 Other fatigue: Secondary | ICD-10-CM | POA: Diagnosis not present

## 2018-09-17 DIAGNOSIS — E039 Hypothyroidism, unspecified: Secondary | ICD-10-CM | POA: Diagnosis not present

## 2018-10-09 DIAGNOSIS — Z79890 Hormone replacement therapy (postmenopausal): Secondary | ICD-10-CM | POA: Diagnosis not present

## 2018-10-09 DIAGNOSIS — E039 Hypothyroidism, unspecified: Secondary | ICD-10-CM | POA: Diagnosis not present

## 2018-10-09 DIAGNOSIS — G4700 Insomnia, unspecified: Secondary | ICD-10-CM | POA: Diagnosis not present

## 2018-10-09 DIAGNOSIS — F331 Major depressive disorder, recurrent, moderate: Secondary | ICD-10-CM | POA: Diagnosis not present

## 2018-10-20 DIAGNOSIS — M5416 Radiculopathy, lumbar region: Secondary | ICD-10-CM | POA: Diagnosis not present

## 2018-11-06 DIAGNOSIS — F411 Generalized anxiety disorder: Secondary | ICD-10-CM | POA: Diagnosis not present

## 2018-11-06 DIAGNOSIS — N951 Menopausal and female climacteric states: Secondary | ICD-10-CM | POA: Diagnosis not present

## 2018-11-06 DIAGNOSIS — Z6827 Body mass index (BMI) 27.0-27.9, adult: Secondary | ICD-10-CM | POA: Diagnosis not present

## 2018-11-06 DIAGNOSIS — F329 Major depressive disorder, single episode, unspecified: Secondary | ICD-10-CM | POA: Diagnosis not present

## 2018-11-26 DIAGNOSIS — M5416 Radiculopathy, lumbar region: Secondary | ICD-10-CM | POA: Diagnosis not present

## 2018-12-14 DIAGNOSIS — E039 Hypothyroidism, unspecified: Secondary | ICD-10-CM | POA: Diagnosis not present

## 2018-12-16 DIAGNOSIS — F341 Dysthymic disorder: Secondary | ICD-10-CM | POA: Diagnosis not present

## 2018-12-18 DIAGNOSIS — R5383 Other fatigue: Secondary | ICD-10-CM | POA: Diagnosis not present

## 2018-12-18 DIAGNOSIS — F329 Major depressive disorder, single episode, unspecified: Secondary | ICD-10-CM | POA: Diagnosis not present

## 2018-12-18 DIAGNOSIS — E039 Hypothyroidism, unspecified: Secondary | ICD-10-CM | POA: Diagnosis not present

## 2018-12-18 DIAGNOSIS — Z6827 Body mass index (BMI) 27.0-27.9, adult: Secondary | ICD-10-CM | POA: Diagnosis not present

## 2018-12-18 DIAGNOSIS — M7711 Lateral epicondylitis, right elbow: Secondary | ICD-10-CM | POA: Diagnosis not present

## 2018-12-24 DIAGNOSIS — F341 Dysthymic disorder: Secondary | ICD-10-CM | POA: Diagnosis not present

## 2018-12-31 DIAGNOSIS — F341 Dysthymic disorder: Secondary | ICD-10-CM | POA: Diagnosis not present

## 2019-01-14 DIAGNOSIS — F341 Dysthymic disorder: Secondary | ICD-10-CM | POA: Diagnosis not present

## 2019-03-03 DIAGNOSIS — Z719 Counseling, unspecified: Secondary | ICD-10-CM | POA: Diagnosis not present

## 2019-03-03 DIAGNOSIS — F411 Generalized anxiety disorder: Secondary | ICD-10-CM | POA: Diagnosis not present

## 2019-03-03 DIAGNOSIS — F329 Major depressive disorder, single episode, unspecified: Secondary | ICD-10-CM | POA: Diagnosis not present

## 2019-03-03 DIAGNOSIS — Z79890 Hormone replacement therapy (postmenopausal): Secondary | ICD-10-CM | POA: Diagnosis not present

## 2019-05-03 DIAGNOSIS — E039 Hypothyroidism, unspecified: Secondary | ICD-10-CM | POA: Diagnosis not present

## 2019-05-05 DIAGNOSIS — E039 Hypothyroidism, unspecified: Secondary | ICD-10-CM | POA: Diagnosis not present

## 2019-05-05 DIAGNOSIS — F411 Generalized anxiety disorder: Secondary | ICD-10-CM | POA: Diagnosis not present

## 2019-05-05 DIAGNOSIS — Z79890 Hormone replacement therapy (postmenopausal): Secondary | ICD-10-CM | POA: Diagnosis not present

## 2019-05-05 DIAGNOSIS — G4700 Insomnia, unspecified: Secondary | ICD-10-CM | POA: Diagnosis not present

## 2019-05-06 DIAGNOSIS — M7711 Lateral epicondylitis, right elbow: Secondary | ICD-10-CM | POA: Diagnosis not present

## 2019-06-21 DIAGNOSIS — F411 Generalized anxiety disorder: Secondary | ICD-10-CM | POA: Diagnosis not present

## 2019-06-21 DIAGNOSIS — F329 Major depressive disorder, single episode, unspecified: Secondary | ICD-10-CM | POA: Diagnosis not present

## 2019-06-21 DIAGNOSIS — G4700 Insomnia, unspecified: Secondary | ICD-10-CM | POA: Diagnosis not present

## 2019-06-24 DIAGNOSIS — L820 Inflamed seborrheic keratosis: Secondary | ICD-10-CM | POA: Diagnosis not present

## 2019-06-24 DIAGNOSIS — L720 Epidermal cyst: Secondary | ICD-10-CM | POA: Diagnosis not present

## 2019-06-24 DIAGNOSIS — D485 Neoplasm of uncertain behavior of skin: Secondary | ICD-10-CM | POA: Diagnosis not present

## 2019-06-24 DIAGNOSIS — L821 Other seborrheic keratosis: Secondary | ICD-10-CM | POA: Diagnosis not present

## 2019-06-24 DIAGNOSIS — D1801 Hemangioma of skin and subcutaneous tissue: Secondary | ICD-10-CM | POA: Diagnosis not present

## 2019-06-29 ENCOUNTER — Other Ambulatory Visit: Payer: Self-pay | Admitting: Family Medicine

## 2019-06-29 DIAGNOSIS — Z1231 Encounter for screening mammogram for malignant neoplasm of breast: Secondary | ICD-10-CM

## 2019-07-02 ENCOUNTER — Other Ambulatory Visit: Payer: Self-pay

## 2019-07-02 DIAGNOSIS — Z20822 Contact with and (suspected) exposure to covid-19: Secondary | ICD-10-CM

## 2019-07-04 LAB — NOVEL CORONAVIRUS, NAA: SARS-CoV-2, NAA: NOT DETECTED

## 2019-08-23 ENCOUNTER — Other Ambulatory Visit: Payer: Self-pay

## 2019-08-23 ENCOUNTER — Ambulatory Visit
Admission: RE | Admit: 2019-08-23 | Discharge: 2019-08-23 | Disposition: A | Discharge status: Home / Self Care | Payer: BLUE CROSS/BLUE SHIELD | Source: Ambulatory Visit | Attending: Family Medicine | Admitting: Family Medicine

## 2019-08-23 DIAGNOSIS — E063 Autoimmune thyroiditis: Secondary | ICD-10-CM | POA: Diagnosis not present

## 2019-08-23 DIAGNOSIS — E559 Vitamin D deficiency, unspecified: Secondary | ICD-10-CM | POA: Diagnosis not present

## 2019-08-23 DIAGNOSIS — G4700 Insomnia, unspecified: Secondary | ICD-10-CM | POA: Diagnosis not present

## 2019-08-23 DIAGNOSIS — R5383 Other fatigue: Secondary | ICD-10-CM | POA: Diagnosis not present

## 2019-08-23 DIAGNOSIS — Z1231 Encounter for screening mammogram for malignant neoplasm of breast: Secondary | ICD-10-CM

## 2019-08-23 DIAGNOSIS — N951 Menopausal and female climacteric states: Secondary | ICD-10-CM | POA: Diagnosis not present

## 2019-08-23 DIAGNOSIS — Z0001 Encounter for general adult medical examination with abnormal findings: Secondary | ICD-10-CM | POA: Diagnosis not present

## 2019-08-25 DIAGNOSIS — N951 Menopausal and female climacteric states: Secondary | ICD-10-CM | POA: Diagnosis not present

## 2019-08-25 DIAGNOSIS — Z01419 Encounter for gynecological examination (general) (routine) without abnormal findings: Secondary | ICD-10-CM | POA: Diagnosis not present

## 2019-08-25 DIAGNOSIS — Z1389 Encounter for screening for other disorder: Secondary | ICD-10-CM | POA: Diagnosis not present

## 2019-08-25 DIAGNOSIS — Z130 Encounter for screening for diseases of the blood and blood-forming organs and certain disorders involving the immune mechanism: Secondary | ICD-10-CM | POA: Diagnosis not present

## 2019-08-25 DIAGNOSIS — Z6827 Body mass index (BMI) 27.0-27.9, adult: Secondary | ICD-10-CM | POA: Diagnosis not present

## 2019-09-03 IMAGING — MG DIGITAL SCREENING BILATERAL MAMMOGRAM WITH TOMO AND CAD
8 series · 9 of 24 positions shown · non-contrast
Comparison: Previous exam(s).

CLINICAL DATA: Screening.

EXAM:
DIGITAL SCREENING BILATERAL MAMMOGRAM WITH TOMO AND CAD

[L MLO synth-2D]
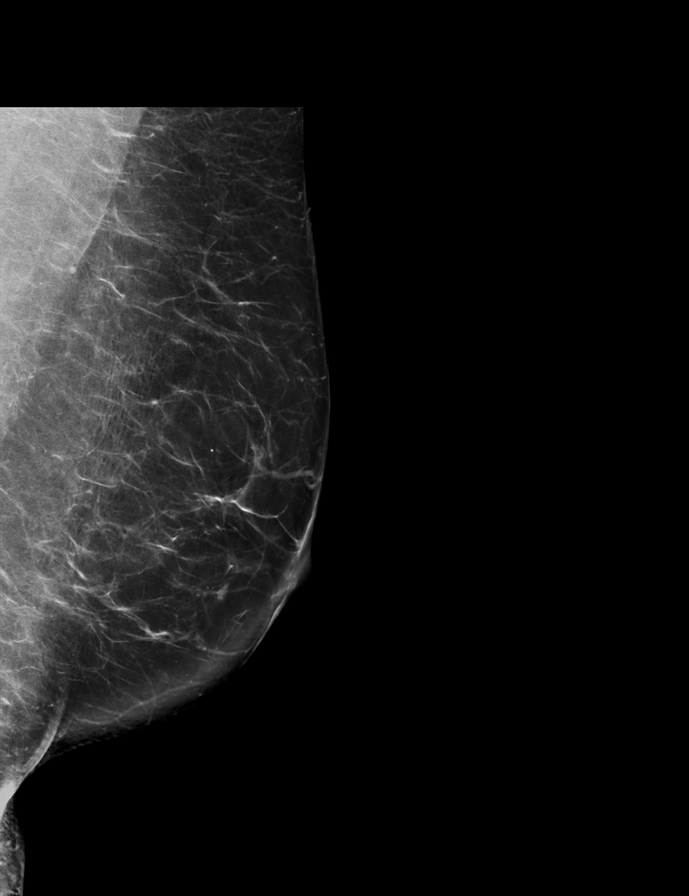

[L CC synth-2D]
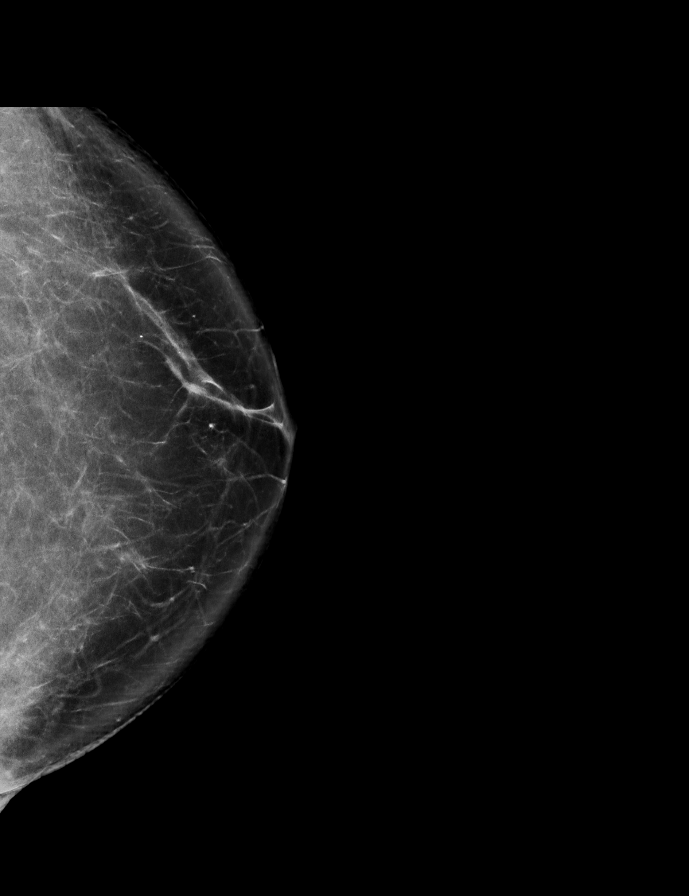

[R CC synth-2D]
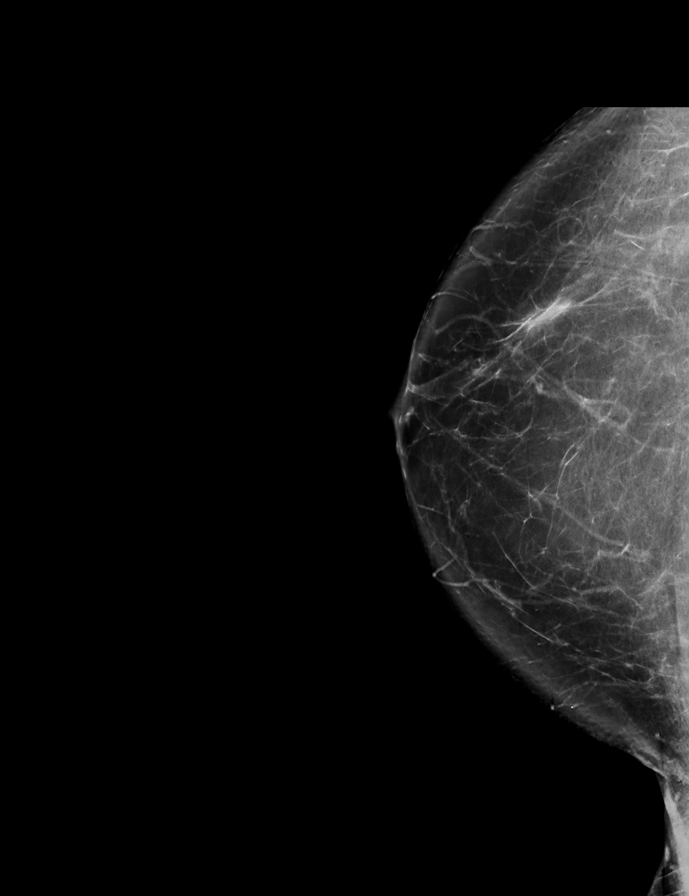

[R MLO synth-2D]
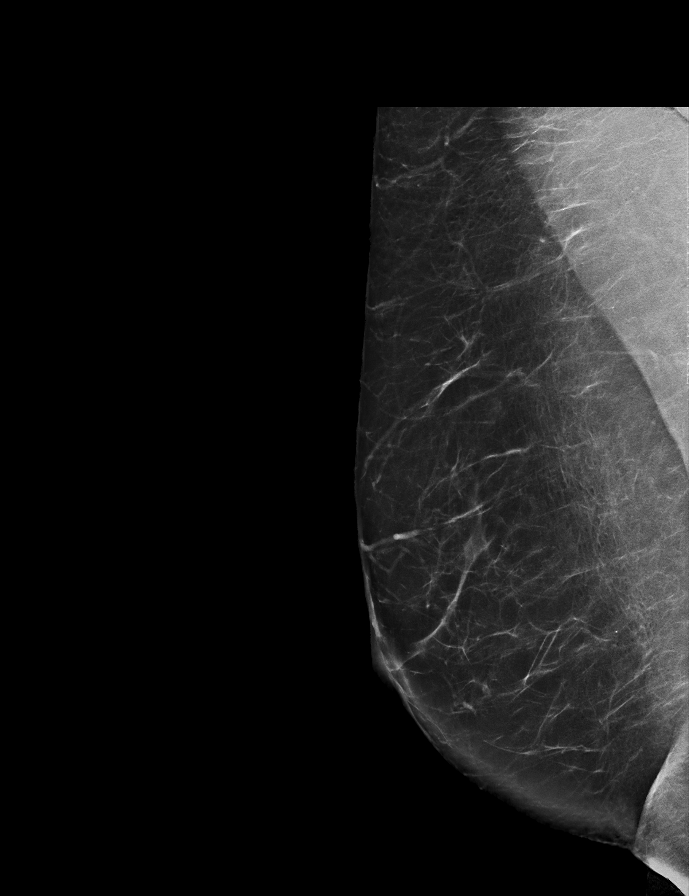

[R MLO tomo · 2 of 87 frames shown]
[frame 29/87]
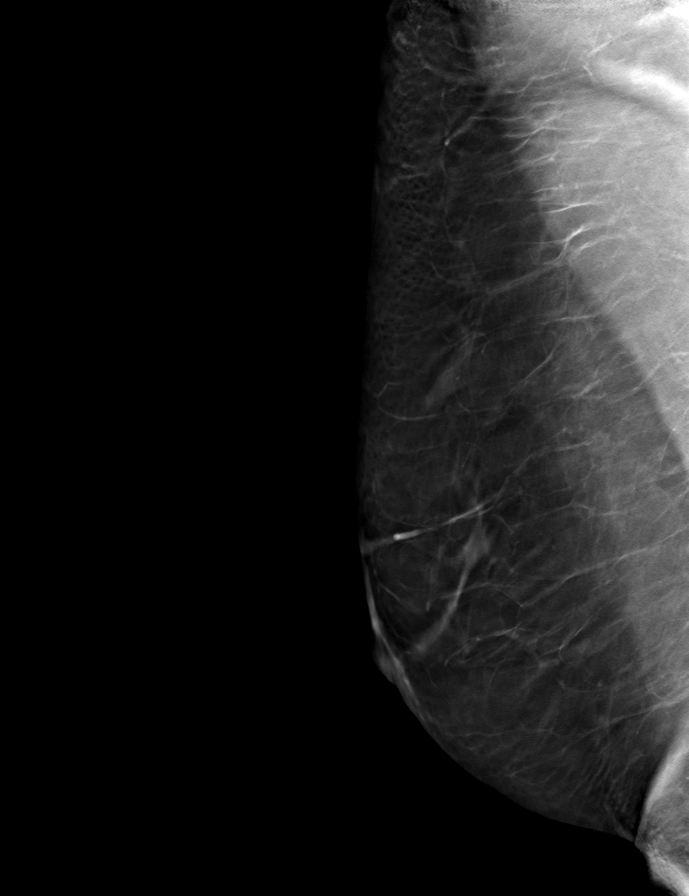
[frame 44/87]
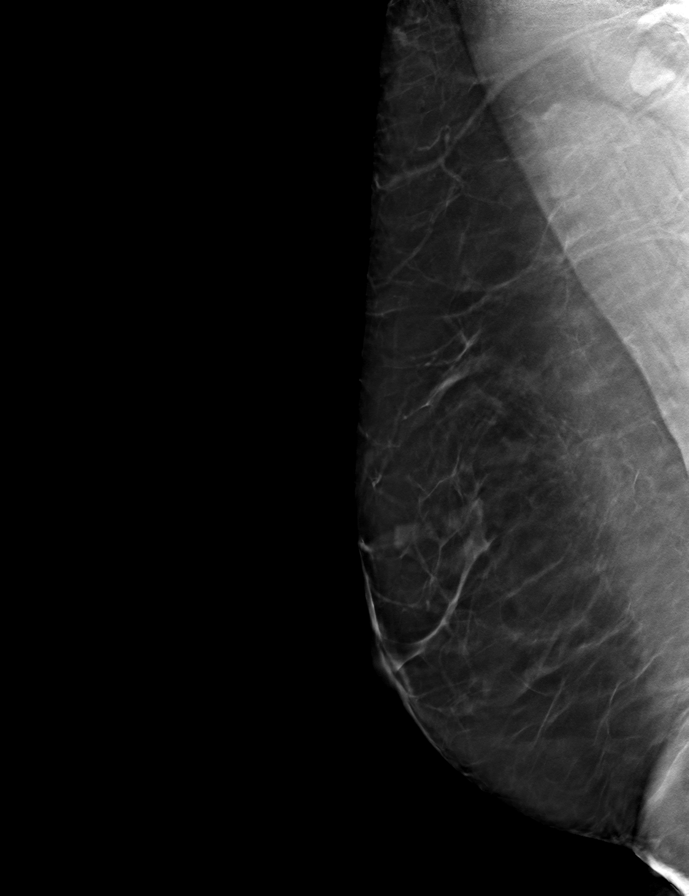

[L CC tomo · tomo slice 43/84.0]
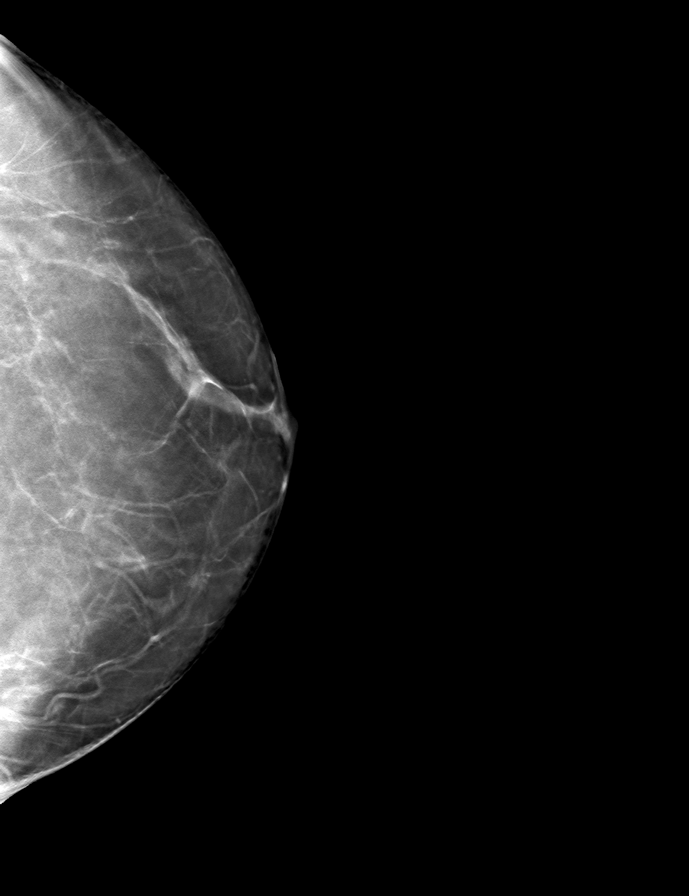

[L MLO tomo · tomo slice 43/85.0]
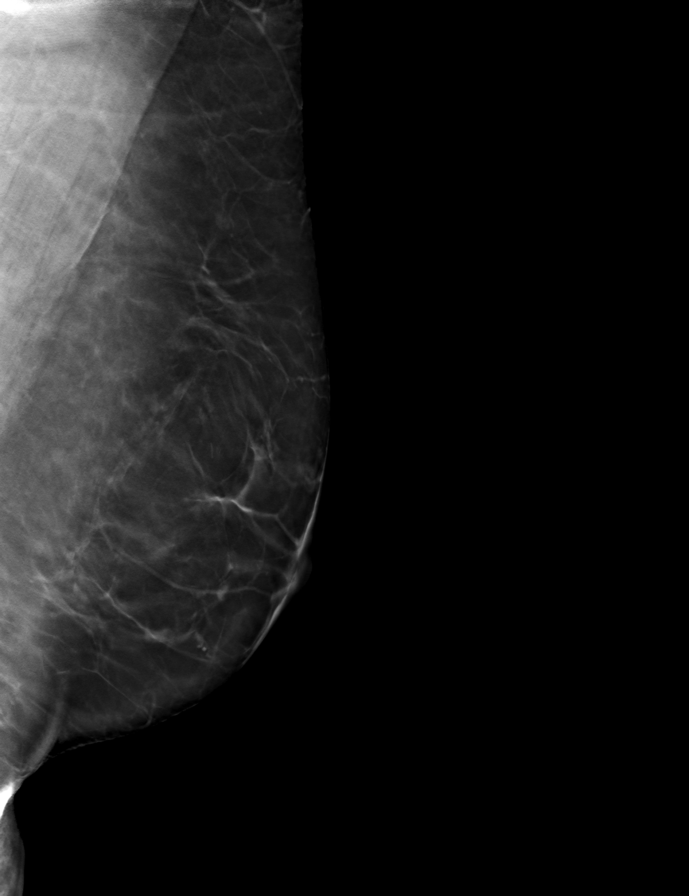

[R CC tomo · tomo slice 42/83.0]
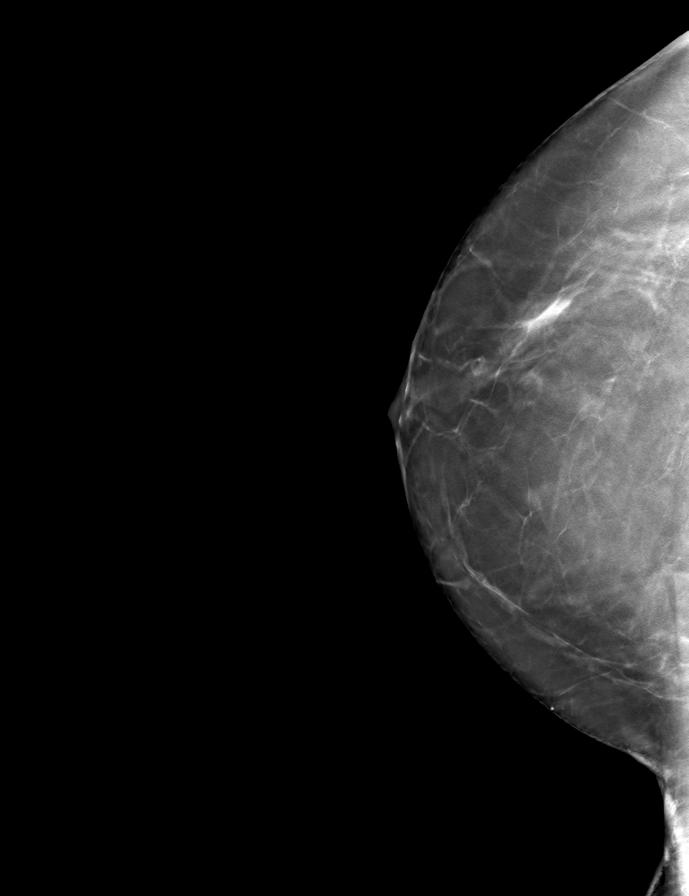

[9 of 24 positions shown; findings below may reference images not displayed]

ACR Breast Density Category b: There are scattered areas of
fibroglandular density.
FINDINGS: There are no findings suspicious for malignancy. Images were
processed with CAD.
IMPRESSION: No mammographic evidence of malignancy. A result letter of this
screening mammogram will be mailed directly to the patient.

RECOMMENDATION:
Screening mammogram in one year. (Code:[id number])

BI-RADS CATEGORY  1: Negative.

## 2019-10-04 DIAGNOSIS — N393 Stress incontinence (female) (male): Secondary | ICD-10-CM | POA: Diagnosis not present

## 2019-10-07 DIAGNOSIS — L309 Dermatitis, unspecified: Secondary | ICD-10-CM | POA: Diagnosis not present

## 2019-12-06 DIAGNOSIS — E559 Vitamin D deficiency, unspecified: Secondary | ICD-10-CM | POA: Diagnosis not present

## 2019-12-06 DIAGNOSIS — E063 Autoimmune thyroiditis: Secondary | ICD-10-CM | POA: Diagnosis not present

## 2019-12-06 DIAGNOSIS — E039 Hypothyroidism, unspecified: Secondary | ICD-10-CM | POA: Diagnosis not present

## 2019-12-06 DIAGNOSIS — E785 Hyperlipidemia, unspecified: Secondary | ICD-10-CM | POA: Diagnosis not present

## 2020-03-23 DIAGNOSIS — Z139 Encounter for screening, unspecified: Secondary | ICD-10-CM | POA: Diagnosis not present

## 2020-03-23 DIAGNOSIS — F411 Generalized anxiety disorder: Secondary | ICD-10-CM | POA: Diagnosis not present

## 2020-03-23 DIAGNOSIS — G43109 Migraine with aura, not intractable, without status migrainosus: Secondary | ICD-10-CM | POA: Diagnosis not present

## 2020-03-23 DIAGNOSIS — G4700 Insomnia, unspecified: Secondary | ICD-10-CM | POA: Diagnosis not present

## 2020-03-23 DIAGNOSIS — F331 Major depressive disorder, recurrent, moderate: Secondary | ICD-10-CM | POA: Diagnosis not present

## 2020-03-30 DIAGNOSIS — Z20828 Contact with and (suspected) exposure to other viral communicable diseases: Secondary | ICD-10-CM | POA: Diagnosis not present

## 2020-05-29 DIAGNOSIS — B373 Candidiasis of vulva and vagina: Secondary | ICD-10-CM | POA: Diagnosis not present

## 2020-05-29 DIAGNOSIS — L292 Pruritus vulvae: Secondary | ICD-10-CM | POA: Diagnosis not present

## 2020-06-23 DIAGNOSIS — L821 Other seborrheic keratosis: Secondary | ICD-10-CM | POA: Diagnosis not present

## 2020-06-23 DIAGNOSIS — L814 Other melanin hyperpigmentation: Secondary | ICD-10-CM | POA: Diagnosis not present

## 2020-06-23 DIAGNOSIS — L509 Urticaria, unspecified: Secondary | ICD-10-CM | POA: Diagnosis not present

## 2020-07-31 DIAGNOSIS — R519 Headache, unspecified: Secondary | ICD-10-CM | POA: Diagnosis not present

## 2020-07-31 DIAGNOSIS — J029 Acute pharyngitis, unspecified: Secondary | ICD-10-CM | POA: Diagnosis not present

## 2020-07-31 DIAGNOSIS — Z1152 Encounter for screening for COVID-19: Secondary | ICD-10-CM | POA: Diagnosis not present

## 2020-08-08 DIAGNOSIS — E063 Autoimmune thyroiditis: Secondary | ICD-10-CM | POA: Diagnosis not present

## 2020-08-08 DIAGNOSIS — E559 Vitamin D deficiency, unspecified: Secondary | ICD-10-CM | POA: Diagnosis not present

## 2020-08-11 DIAGNOSIS — H1045 Other chronic allergic conjunctivitis: Secondary | ICD-10-CM | POA: Diagnosis not present

## 2020-08-31 ENCOUNTER — Other Ambulatory Visit: Payer: Self-pay | Admitting: Family Medicine

## 2020-08-31 DIAGNOSIS — Z1231 Encounter for screening mammogram for malignant neoplasm of breast: Secondary | ICD-10-CM

## 2020-09-06 DIAGNOSIS — M7712 Lateral epicondylitis, left elbow: Secondary | ICD-10-CM | POA: Diagnosis not present

## 2020-09-12 DIAGNOSIS — M25522 Pain in left elbow: Secondary | ICD-10-CM | POA: Diagnosis not present

## 2020-09-18 DIAGNOSIS — Z1151 Encounter for screening for human papillomavirus (HPV): Secondary | ICD-10-CM | POA: Diagnosis not present

## 2020-09-18 DIAGNOSIS — Z1231 Encounter for screening mammogram for malignant neoplasm of breast: Secondary | ICD-10-CM | POA: Diagnosis not present

## 2020-09-18 DIAGNOSIS — Z01419 Encounter for gynecological examination (general) (routine) without abnormal findings: Secondary | ICD-10-CM | POA: Diagnosis not present

## 2020-09-18 DIAGNOSIS — Z6827 Body mass index (BMI) 27.0-27.9, adult: Secondary | ICD-10-CM | POA: Diagnosis not present

## 2020-09-18 DIAGNOSIS — Z124 Encounter for screening for malignant neoplasm of cervix: Secondary | ICD-10-CM | POA: Diagnosis not present

## 2020-09-18 DIAGNOSIS — Z130 Encounter for screening for diseases of the blood and blood-forming organs and certain disorders involving the immune mechanism: Secondary | ICD-10-CM | POA: Diagnosis not present

## 2020-09-19 ENCOUNTER — Telehealth: Payer: Self-pay | Admitting: Genetic Counselor

## 2020-09-19 NOTE — Telephone Encounter (Signed)
Received a genetic counseling referral from Dr. Ellyn Hack for fhx of breast cancer. Ms. Halliday has been cld and scheduled to see Clydie Braun on 11/29 at 2pm. Pt aware to arrive 15 minutes early. Letter mailed.

## 2020-09-27 ENCOUNTER — Ambulatory Visit (INDEPENDENT_AMBULATORY_CARE_PROVIDER_SITE_OTHER): Payer: BC Managed Care – PPO | Admitting: Internal Medicine

## 2020-09-27 ENCOUNTER — Ambulatory Visit: Payer: BC Managed Care – PPO

## 2020-09-27 ENCOUNTER — Encounter: Payer: Self-pay | Admitting: Internal Medicine

## 2020-09-27 ENCOUNTER — Other Ambulatory Visit: Payer: Self-pay

## 2020-09-27 VITALS — BP 130/80 | HR 68 | Ht 65.0 in | Wt 161.0 lb

## 2020-09-27 DIAGNOSIS — I341 Nonrheumatic mitral (valve) prolapse: Secondary | ICD-10-CM | POA: Diagnosis not present

## 2020-09-27 DIAGNOSIS — Q231 Congenital insufficiency of aortic valve: Secondary | ICD-10-CM

## 2020-09-27 DIAGNOSIS — M25522 Pain in left elbow: Secondary | ICD-10-CM | POA: Diagnosis not present

## 2020-09-27 NOTE — Progress Notes (Signed)
Cardiology Office Note:    Date:  09/27/2020   ID:  Lisa Lynn, DOB 1961-06-19, MRN [id number]  PCP:  Lewis Moccasin, MD  Cardiologist:  No primary care provider on file.  Electrophysiologist:  None   Referring MD: Lewis Moccasin, MD   Chief Complaint/Reason for Referral: Bicuspid aortic valve, mitral valve prolapse  History of Present Illness:    Lisa Lynn is a 59 y.o. female with a history of heart murmur felt to represent bicuspid aortic valve and mitral valve prolapse who presents for follow up. She has previously been evaluated by Dr. Yates Decamp >5 years ago, and was recommended to my clinic by Dr. Shawnie Pons. Valve lesions diagnosed around 5 years ago and was told to follow up over time given no significant issues.   She feels well overall and remains active. The patient denies chest pain, chest pressure, dyspnea at rest or with exertion, palpitations, PND, orthopnea, or leg swelling. Denies cough, fever, chills. Denies nausea, vomiting. Denies syncope or presyncope. Denies dizziness or lightheadedness. Denies snoring.  No known family history of BAV, TAA or aortic aneurysm rupture or dissection.   FHX: Father with CHF early 61s.  No MI history, no SCD.   We reviewed screening of first degree family members for both bicuspid aortic valve and thoracic aortic aneurysm. She has siblings and biological children for whom this would be pertinent.  Past Medical History:  Diagnosis Date  . Heart murmur   . Thyroid disease    hypothyroid    Past Surgical History:  Procedure Laterality Date  . BACK SURGERY  2010  . SPINE SURGERY      Current Medications: Current Meds  Medication Sig  . estradiol (VIVELLE-DOT) 0.075 MG/24HR Place onto the skin.  . progesterone (PROMETRIUM) 100 MG capsule Take by mouth.  . thyroid (NP THYROID) 60 MG tablet Take 60 mg by mouth daily before breakfast.  . UBRELVY 100 MG TABS Take by mouth.     Allergies:    Patient has no known allergies.   Social History   Tobacco Use  . Smoking status: Former Games developer  . Smokeless tobacco: Never Used  . Tobacco comment: teenager  Substance Use Topics  . Alcohol use: Yes    Comment: 2-4 drinks a week  . Drug use: No     Family History: The patient's family history includes Breast cancer in her maternal aunt; Breast cancer (age of onset: 65) in her mother; Heart disease in her father; Hypertension in her mother; Stroke in her maternal grandmother and paternal grandmother.  ROS:   Please see the history of present illness.    All other systems reviewed and are negative.  EKGs/Labs/Other Studies Reviewed:    The following studies were reviewed today:  EKG:  NSR, anterior infarct pattern.   Recent Labs: No results found for requested labs within last 8760 hours.  Recent Lipid Panel    Component Value Date/Time   CHOL 205 (H) 10/25/2015 0922   TRIG 56 10/25/2015 0922   HDL 78 10/25/2015 0922   CHOLHDL 2.6 10/25/2015 0922   VLDL 11 10/25/2015 0922   LDLCALC 116 10/25/2015 0922    Physical Exam:    VS:  BP 130/80   Pulse 68   Ht 5\' 5"  (1.651 m)   Wt 161 lb (73 kg)   SpO2 98%   BMI 26.79 kg/m     Wt Readings from Last 5 Encounters:  09/27/20 161 lb (73 kg)  12/27/16 160 lb (72.6 kg)  10/21/16 160 lb 6.4 oz (72.8 kg)  10/19/15 159 lb 9.6 oz (72.4 kg)  09/21/15 159 lb (72.1 kg)    Constitutional: No acute distress Eyes: sclera non-icteric, normal conjunctiva and lids ENMT: normal dentition, moist mucous membranes Cardiovascular: regular rhythm, normal rate, no murmurs, no mid systolic click. S1 and S2 normal. Radial pulses normal bilaterally. No jugular venous distention.  Respiratory: clear to auscultation bilaterally GI : normal bowel sounds, soft and nontender. No distention.   MSK: extremities warm, well perfused. No edema.  NEURO: grossly nonfocal exam, moves all extremities. PSYCH: alert and oriented x 3, normal mood and  affect.   ASSESSMENT:    1. MVP (mitral valve prolapse)   2. Bicuspid aortic valve    PLAN:    MVP (mitral valve prolapse) - Plan: EKG 12-Lead, ECHOCARDIOGRAM COMPLETE  Bicuspid aortic valve - Plan: EKG 12-Lead, ECHOCARDIOGRAM COMPLETE   We will perform an echocardiogram to evaluate valvular lesions as well as screen for aortic aneurysm. EKG shows anterior infarct pattern, suspect this is artifact but will confirm with echo.  Discussed family screening in detail, I'd be happy to see any of her family members who are local and in need of a cardiologist for screening.   Weston Brass, MD [place]  [facility]    Medication Adjustments/Labs and Tests Ordered: Current medicines are reviewed at length with the patient today.  Concerns regarding medicines are outlined above.   Orders Placed This Encounter  Procedures  . EKG 12-Lead  . ECHOCARDIOGRAM COMPLETE    No orders of the defined types were placed in this encounter.   Patient Instructions  Medication Instructions:  No Changes In Medications at this time.  *If you need a refill on your cardiac medications before your next appointment, please call your pharmacy*  Lab Work: None Ordered At This Time.  If you have labs (blood work) drawn today and your tests are completely normal, you will receive your results only by: Marland Kitchen MyChart Message (if you have MyChart) OR . A paper copy in the mail If you have any lab test that is abnormal or we need to change your treatment, we will call you to review the results.  Testing/Procedures: Your physician has requested that you have an echocardiogram. Echocardiography is a painless test that uses sound waves to create images of your heart. It provides your doctor with information about the size and shape of your heart and how well your heart's chambers and valves are working. You may receive an ultrasound enhancing agent through an IV if needed to better visualize your heart during  the echo.This procedure takes approximately one hour. There are no restrictions for this procedure. This will take place at the [address], [address].   Follow-Up: At [facility], you and your health needs are our priority.  As part of our continuing mission to provide you with exceptional heart care, we have created designated Provider Care Teams.  These Care Teams include your primary Cardiologist (physician) and Advanced Practice Providers (APPs -  Physician Assistants and Nurse Practitioners) who all work together to provide you with the care you need, when you need it.  Your next appointment:   1 month(s)  The format for your next appointment:   Virtual Visit   Provider:   Weston Brass, MD

## 2020-09-27 NOTE — Patient Instructions (Signed)
Medication Instructions:  No Changes In Medications at this time.  *If you need a refill on your cardiac medications before your next appointment, please call your pharmacy*  Lab Work: None Ordered At This Time.  If you have labs (blood work) drawn today and your tests are completely normal, you will receive your results only by: . MyChart Message (if you have MyChart) OR . A paper copy in the mail If you have any lab test that is abnormal or we need to change your treatment, we will call you to review the results.  Testing/Procedures: Your physician has requested that you have an echocardiogram. Echocardiography is a painless test that uses sound waves to create images of your heart. It provides your doctor with information about the size and shape of your heart and how well your heart's chambers and valves are working. You may receive an ultrasound enhancing agent through an IV if needed to better visualize your heart during the echo.This procedure takes approximately one hour. There are no restrictions for this procedure. This will take place at the [address], [address].   Follow-Up: At [facility], you and your health needs are our priority.  As part of our continuing mission to provide you with exceptional heart care, we have created designated Provider Care Teams.  These Care Teams include your primary Cardiologist (physician) and Advanced Practice Providers (APPs -  Physician Assistants and Nurse Practitioners) who all work together to provide you with the care you need, when you need it.  Your next appointment:   1 month(s)  The format for your next appointment:   Virtual Visit   Provider:   Gayatri Acharya, MD    

## 2020-10-09 DIAGNOSIS — G4700 Insomnia, unspecified: Secondary | ICD-10-CM | POA: Diagnosis not present

## 2020-10-13 DIAGNOSIS — R21 Rash and other nonspecific skin eruption: Secondary | ICD-10-CM | POA: Diagnosis not present

## 2020-10-13 DIAGNOSIS — R5383 Other fatigue: Secondary | ICD-10-CM | POA: Diagnosis not present

## 2020-10-13 DIAGNOSIS — M791 Myalgia, unspecified site: Secondary | ICD-10-CM | POA: Diagnosis not present

## 2020-10-13 DIAGNOSIS — R519 Headache, unspecified: Secondary | ICD-10-CM | POA: Diagnosis not present

## 2020-10-16 ENCOUNTER — Inpatient Hospital Stay: Payer: BC Managed Care – PPO | Attending: Genetic Counselor | Admitting: Genetic Counselor

## 2020-10-16 ENCOUNTER — Inpatient Hospital Stay: Payer: BC Managed Care – PPO

## 2020-10-16 ENCOUNTER — Other Ambulatory Visit: Payer: Self-pay

## 2020-10-16 DIAGNOSIS — Z803 Family history of malignant neoplasm of breast: Secondary | ICD-10-CM

## 2020-10-17 ENCOUNTER — Encounter: Payer: Self-pay | Admitting: Genetic Counselor

## 2020-10-17 DIAGNOSIS — Z803 Family history of malignant neoplasm of breast: Secondary | ICD-10-CM | POA: Insufficient documentation

## 2020-10-17 NOTE — Progress Notes (Addendum)
REFERRING PROVIDER: Janyth Contes, MD [address] [place],  [place] [address]  PRIMARY PROVIDER:  Fanny Bien, MD  PRIMARY REASON FOR VISIT:  1. Family history of breast cancer      HISTORY OF PRESENT ILLNESS:   Lisa Lynn, a 59 y.o. female, was seen for a [place] cancer genetics consultation at the request of Dr. Sandford Craze due to a family history of breast cancer.  Lisa Lynn presents to clinic today to discuss the possibility of a hereditary predisposition to cancer, genetic testing, and to further clarify her future cancer risks, as well as potential cancer risks for family members.   Lisa Lynn is a 59 y.o. female with no personal history of cancer.  She is concerned about her family history of breast cancer and her risk for cancer.  CANCER HISTORY:  Oncology History   No history exists.     RISK FACTORS:  Menarche was at age 45-12.  First live birth at age 76.  OCP use for approximately 10 years.  Ovaries intact: yes.  Hysterectomy: no.  Menopausal status: postmenopausal.  HRT use: 8 years. Colonoscopy: yes; 2 polyps, currently on a 5 year schedule. Mammogram within the last year: yes. Number of breast biopsies: 0. Up to date with pelvic exams: yes. Any excessive radiation exposure in the past: no  Past Medical History:  Diagnosis Date  . Family history of breast cancer   . Heart murmur   . Thyroid disease    hypothyroid    Past Surgical History:  Procedure Laterality Date  . BACK SURGERY  2010  . SPINE SURGERY      Social History   Socioeconomic History  . Marital status: Married    Spouse name: Not on file  . Number of children: Not on file  . Years of education: Not on file  . Highest education level: Not on file  Occupational History  . Not on file  Tobacco Use  . Smoking status: Former Research scientist (life sciences)  . Smokeless tobacco: Never Used  . Tobacco comment: teenager  Substance and Sexual Activity  . Alcohol use: Yes      Comment: 2-4 drinks a week  . Drug use: No  . Sexual activity: Yes    Birth control/protection: Post-menopausal  Other Topics Concern  . Not on file  Social History Narrative  . Not on file   Social Determinants of Health   Financial Resource Strain:   . Difficulty of Paying Living Expenses: Not on file  Food Insecurity:   . Worried About Charity fundraiser in the Last Year: Not on file  . Ran Out of Food in the Last Year: Not on file  Transportation Needs:   . Lack of Transportation (Medical): Not on file  . Lack of Transportation (Non-Medical): Not on file  Physical Activity:   . Days of Exercise per Week: Not on file  . Minutes of Exercise per Session: Not on file  Stress:   . Feeling of Stress : Not on file  Social Connections:   . Frequency of Communication with Friends and Family: Not on file  . Frequency of Social Gatherings with Friends and Family: Not on file  . Attends Religious Services: Not on file  . Active Member of Clubs or Organizations: Not on file  . Attends Archivist Meetings: Not on file  . Marital Status: Not on file     FAMILY HISTORY:  We obtained a detailed, 4-generation family history.  Significant diagnoses are listed below: Family History  Problem Relation Age of Onset  . Hypertension Mother   . Breast cancer Mother 55  . Heart disease Father   . Stroke Maternal Grandmother   . Stroke Paternal Grandmother   . Heart attack Paternal Grandfather   . Breast cancer Maternal Aunt 51  . Testicular cancer Paternal Uncle 68    The patient has a son and daughter who are cancer free.  She has a brother and sister who are cancer free.  Both parents are living.  The patient's mother had breast cancer at 7.  She took tamoxifen after her diagnosis, suggesting that this was an ER+ breast cancer.  Her mother had a brother and sister.  The sister had breast cancer at 48 and died of metastatic breast cancer.  The maternal grandparents are  deceased.  The grandmother had a stroke and the grandfather died in his 29's from an accident.  The grandmother has a sister who had metastatic breast cancer.  The patient's father is cancer free. He had a brother who died of testicular cancer at 23.  His mother died of pneumonia and his father died of a heart attack.  Lisa Lynn is unaware of previous family history of genetic testing for hereditary cancer risks. Patient's maternal ancestors are of [place] descent, and paternal ancestors are of English descent. There is no reported Ashkenazi Jewish ancestry. There is no known consanguinity.  GENETIC COUNSELING ASSESSMENT: Lisa Lynn is a 58 y.o. female with a family history of breast cancer which is somewhat suggestive of a familial predisposition to cancer, rather than hereditary, given the number of people in the family affected with breast cancer and diagnoses over age 33. We, therefore, discussed and recommended the following at today's visit.   DISCUSSION: We discussed that 5 - 10% of breast cancer is hereditary, with most cases associated with BRCA mutations.  There are other genes that can be associated with hereditary breast cancer syndromes.  These include ATM, CHEK2 and PALB2.  Based on her family history, she does not qualify for genetic testing, unless her great aunt (her MGM's sister) had breast/ovarian/pancreatic cancer.  The patient is unsure what kind of cancer that great aunt had, and will ask her mother; however, she feels that her mother will need to research this and that we will not have a quick answer.    We discussed with Lisa Lynn that the family history does not meet insurance or NCCN criteria for genetic testing and, therefore, is not highly consistent with a familial hereditary cancer syndrome.  We feel she is at low risk to harbor a gene mutation associated with such a condition. Even though she does not meet insurance criteria for genetic testing, we can perform testing and  she can pay out of pocket.  The patient feels that this is an acceptable option.  The cost of testing will be around $250.  We reviewed the characteristics, features and inheritance patterns of hereditary cancer syndromes. We also discussed genetic testing, including the appropriate family members to test, the process of testing, insurance coverage and turn-around-time for results. We discussed the implications of a negative, positive, carrier and/or variant of uncertain significant result. We recommended Lisa Lynn pursue genetic testing for the multi-cancer gene panel. The Multi-Gene Panel offered by [facility] includes sequencing and/or deletion duplication testing of the following 85 genes: AIP, ALK, APC, ATM, AXIN2,BAP1,  BARD1, BLM, BMPR1A, BRCA1, BRCA2, BRIP1, CASR, CDC73, CDH1, CDK4, CDKN1B, CDKN1C, CDKN2A (  p14ARF), CDKN2A (p16INK4a), CEBPA, CHEK2, CTNNA1, DICER1, DIS3L2, EGFR (c.2369C>T, p.Thr790Met variant only), EPCAM (Deletion/duplication testing only), FH, FLCN, GATA2, GPC3, GREM1 (Promoter region deletion/duplication testing only), HOXB13 (c.251G>A, p.Gly84Glu), HRAS, KIT, MAX, MEN1, MET, MITF (c.952G>A, p.Glu318Lys variant only), MLH1, MSH2, MSH3, MSH6, MUTYH, NBN, NF1, NF2, NTHL1, PALB2, PDGFRA, PHOX2B, PMS2, POLD1, POLE, POT1, PRKAR1A, PTCH1, PTEN, RAD50, RAD51C, RAD51D, RB1, RECQL4, RET, RNF43, RUNX1, SDHAF2, SDHA (sequence changes only), SDHB, SDHC, SDHD, SMAD4, SMARCA4, SMARCB1, SMARCE1, STK11, SUFU, TERC, TERT, TMEM127, TP53, TSC1, TSC2, VHL, WRN and WT1.    After the patient left clinic, a risk model for breast cancer was performed.  Based on the patient's family history, a statistical model Air cabin crew) was used to estimate her risk of developing breast cancer. This estimates her lifetime risk of developing breast cancer to be approximately 13.9%. This estimation does not consider any genetic testing results.  The patient's lifetime breast cancer risk is a preliminary estimate based on  available information using one of several models endorsed by the [place] (ACS). The ACS recommends consideration of breast MRI screening as an adjunct to mammography for patients at high risk (defined as 20% or greater lifetime risk). Please note that a woman's breast cancer risk changes over time. It may increase or decrease based on age and any changes to the personal and/or family medical history. The risks and recommendations listed above apply to this patient at this point in time. In the future, she may or may not be eligible for the same medical management strategies and, in some cases, other medical management strategies may become available to her. If she is interested in an updated breast cancer risk assessment at a later date, she can contact us.  Lisa Lynn is not considered to be at high risk for breast cancer.     PLAN: After considering the risks, benefits, and limitations, Lisa Lynn provided informed consent to pursue genetic testing and the blood sample was sent to [facility] for analysis of the Multi-cancer gene panel. Results should be available within approximately 2-3 weeks' time, at which point they will be disclosed by telephone to Lisa Lynn, as will any additional recommendations warranted by these results. Lisa Lynn will receive a summary of her genetic counseling visit and a copy of her results once available. This information will also be available in Epic.   Lastly, we encouraged Ms. Batres to remain in contact with cancer genetics annually so that we can continuously update the family history and inform her of any changes in cancer genetics and testing that may be of benefit for this family.   Lisa Lynn questions were answered to her satisfaction today. Our contact information was provided should additional questions or concerns arise. Thank you for the referral and allowing [place] to share in the care of your patient.   Zionah Criswell P. Florene Glen, [place],  [facility] Licensed, Insurance risk surveyor [email or web address] phone: [phone]  The patient was seen for a total of 45 minutes in face-to-face genetic counseling.  This patient was discussed with Drs. Magrinat, Lindi Adie and/or Burr Medico who agrees with the above.    _______________________________________________________________________ For Office Staff:  Number of people involved in session: 1 Was an Intern/ student involved with case: no

## 2020-10-18 ENCOUNTER — Encounter: Payer: Self-pay | Admitting: Genetic Counselor

## 2020-10-18 DIAGNOSIS — N393 Stress incontinence (female) (male): Secondary | ICD-10-CM | POA: Diagnosis not present

## 2020-10-19 ENCOUNTER — Other Ambulatory Visit: Payer: Self-pay

## 2020-10-19 ENCOUNTER — Inpatient Hospital Stay: Payer: BC Managed Care – PPO | Attending: Genetic Counselor

## 2020-10-19 DIAGNOSIS — Z803 Family history of malignant neoplasm of breast: Secondary | ICD-10-CM | POA: Diagnosis not present

## 2020-10-19 MED ORDER — ALTEPLASE 2 MG IJ SOLR
INTRAMUSCULAR | Status: AC
  Filled 2020-10-19: qty 2

## 2020-10-23 ENCOUNTER — Ambulatory Visit (HOSPITAL_COMMUNITY): Payer: BC Managed Care – PPO | Attending: Cardiology

## 2020-10-23 ENCOUNTER — Other Ambulatory Visit: Payer: Self-pay

## 2020-10-23 DIAGNOSIS — I341 Nonrheumatic mitral (valve) prolapse: Secondary | ICD-10-CM | POA: Insufficient documentation

## 2020-10-23 DIAGNOSIS — Q231 Congenital insufficiency of aortic valve: Secondary | ICD-10-CM | POA: Diagnosis not present

## 2020-10-23 LAB — ECHOCARDIOGRAM COMPLETE
AR max vel: 1.43 cm2
AV Area VTI: 1.58 cm2
AV Area mean vel: 1.47 cm2
AV Mean grad: 7 mmHg
AV Peak grad: 13 mmHg
Ao pk vel: 1.8 m/s
Area-P 1/2: 4.31 cm2
P 1/2 time: 385 msec
S' Lateral: 3.2 cm

## 2020-10-27 ENCOUNTER — Telehealth (INDEPENDENT_AMBULATORY_CARE_PROVIDER_SITE_OTHER): Payer: BC Managed Care – PPO | Admitting: Internal Medicine

## 2020-10-27 ENCOUNTER — Encounter: Payer: Self-pay | Admitting: Internal Medicine

## 2020-10-27 VITALS — Ht 65.0 in | Wt 155.0 lb

## 2020-10-27 DIAGNOSIS — Q231 Congenital insufficiency of aortic valve: Secondary | ICD-10-CM | POA: Diagnosis not present

## 2020-10-27 NOTE — Progress Notes (Signed)
Virtual Visit via Telephone Note   This visit type was conducted due to national recommendations for restrictions regarding the COVID-19 Pandemic (e.g. social distancing) in an effort to limit this patient's exposure and mitigate transmission in our community.  Due to her co-morbid illnesses, this patient is at least at moderate risk for complications without adequate follow up.  This format is felt to be most appropriate for this patient at this time.  The patient did not have access to video technology/had technical difficulties with video requiring transitioning to audio format only (telephone).  All issues noted in this document were discussed and addressed.  No physical exam could be performed with this format.  Please refer to the patient's chart for her  consent to telehealth for [facility].    Date:  10/27/2020   ID:  Lisa Lynn, DOB 07-22-1961, MRN [id number] The patient was identified using 2 identifiers.  Patient Location: Home Provider Location: Home Office  PCP:  Lewis Moccasin, MD  Cardiologist:  No primary care provider on file.  Electrophysiologist:  None   Evaluation Performed:  Follow-Up Visit  Chief Complaint:  Bicuspid aortic valve  History of Present Illness:    Lisa Lynn is a 59 y.o. female with bicuspid aortic valve and hypothyroidism.  We discussed echo results showing BAV with no significant stenosis. Mild-moderate aortic valve regurgitation, we will follow this with serial echos. No mitral valve prolapse seen.   We discussed natural history of BAV, and need for first degree family screening. Her children will be in town over the holidays, she will try to get them scheduled with PCP to review. I am happy to see them and coordinate echos if needed.  The patient does not have symptoms concerning for COVID-19 infection (fever, chills, cough, or new shortness of breath).    Past Medical History:  Diagnosis Date  . Family history of  breast cancer   . Heart murmur   . Thyroid disease    hypothyroid   Past Surgical History:  Procedure Laterality Date  . BACK SURGERY  2010  . SPINE SURGERY       Current Meds  Medication Sig  . estradiol (VIVELLE-DOT) 0.075 MG/24HR Place onto the skin.  . progesterone (PROMETRIUM) 100 MG capsule Take by mouth.  . thyroid (ARMOUR) 60 MG tablet Take 60 mg by mouth daily before breakfast.  . UBRELVY 100 MG TABS Take by mouth.  . zolpidem (AMBIEN) 5 MG tablet Take 5 mg by mouth at bedtime as needed for sleep.     Allergies:   Patient has no known allergies.   Social History   Tobacco Use  . Smoking status: Former Games developer  . Smokeless tobacco: Never Used  . Tobacco comment: teenager  Substance Use Topics  . Alcohol use: Yes    Comment: 2-4 drinks a week  . Drug use: No     Family Hx: The patient's family history includes Breast cancer in an other family member; Breast cancer (age of onset: 69) in her maternal aunt; Breast cancer (age of onset: 72) in her mother; Heart attack in her paternal grandfather; Heart disease in her father; Hypertension in her mother; Stroke in her maternal grandmother and paternal grandmother; Testicular cancer (age of onset: 62) in her paternal uncle.  ROS:   Please see the history of present illness.     All other systems reviewed and are negative.   Prior CV studies:   The following studies were reviewed today:  Labs/Other Tests and Data Reviewed:    EKG:  No ECG reviewed.  Recent Labs: No results found for requested labs within last 8760 hours.   Recent Lipid Panel Lab Results  Component Value Date/Time   CHOL 205 (H) 10/25/2015 09:22 AM   TRIG 56 10/25/2015 09:22 AM   HDL 78 10/25/2015 09:22 AM   CHOLHDL 2.6 10/25/2015 09:22 AM   LDLCALC 116 10/25/2015 09:22 AM    Wt Readings from Last 3 Encounters:  10/27/20 155 lb (70.3 kg)  09/27/20 161 lb (73 kg)  12/27/16 160 lb (72.6 kg)     Risk Assessment/Calculations:       Objective:    Vital Signs:  Ht 5\' 5"  (1.651 m)   Wt 155 lb (70.3 kg)   BMI 25.79 kg/m    VITAL SIGNS:  reviewed GEN:  no acute distress RESPIRATORY:  normal respiratory effort, no increased work of breathing NEURO:  alert and oriented x 3, speech normal PSYCH:  normal affect   ASSESSMENT & PLAN:    1. Bicuspid aortic valve    Normal aorta dimensions, no coarctation, no BAV stenosis. Mild-moderate regurgitation, will follow in 1-2 years with echo. First deg family screening reviewed for BAV and aorta purposes.  No evidence of mitral valve prolapse on this echo.   COVID-19 Education: The signs and symptoms of COVID-19 were discussed with the patient and how to seek care for testing (follow up with PCP or arrange E-visit).  The importance of social distancing was discussed today.  Time:   Today, I have spent 15 minutes with the patient with telehealth technology discussing the above problems.     Medication Adjustments/Labs and Tests Ordered: Current medicines are reviewed at length with the patient today.  Concerns regarding medicines are outlined above.   Patient Instructions  Medication Instructions:  No Changes In Medications at this time.  *If you need a refill on your cardiac medications before your next appointment, please call your pharmacy*  Follow-Up: At [facility], you and your health needs are our priority.  As part of our continuing mission to provide you with exceptional heart care, we have created designated Provider Care Teams.  These Care Teams include your primary Cardiologist (physician) and Advanced Practice Providers (APPs -  Physician Assistants and Nurse Practitioners) who all work together to provide you with the care you need, when you need it.  Your next appointment:   6 month(s)  The format for your next appointment:   In Person  Provider:   [facility], MD        Signed, Weston Brass, MD  10/27/2020 9:22 AM    [facility]

## 2020-10-27 NOTE — Patient Instructions (Signed)

## 2020-11-02 DIAGNOSIS — N3641 Hypermobility of urethra: Secondary | ICD-10-CM | POA: Diagnosis not present

## 2020-11-02 DIAGNOSIS — N393 Stress incontinence (female) (male): Secondary | ICD-10-CM | POA: Diagnosis not present

## 2020-11-06 ENCOUNTER — Telehealth: Payer: Self-pay | Admitting: Genetic Counselor

## 2020-11-06 ENCOUNTER — Encounter: Payer: Self-pay | Admitting: Genetic Counselor

## 2020-11-06 DIAGNOSIS — Z1379 Encounter for other screening for genetic and chromosomal anomalies: Secondary | ICD-10-CM | POA: Insufficient documentation

## 2020-11-06 NOTE — Telephone Encounter (Signed)
LM on VM that results are back and to please call. 

## 2020-11-07 ENCOUNTER — Ambulatory Visit: Payer: Self-pay | Admitting: Genetic Counselor

## 2020-11-07 DIAGNOSIS — Z1379 Encounter for other screening for genetic and chromosomal anomalies: Secondary | ICD-10-CM

## 2020-11-07 NOTE — Telephone Encounter (Signed)
Revealed negative genetic testing.  Discussed that we do not know why there is cancer in the family. It could be due to a different gene that we are not testing, or maybe our current technology may not be able to pick something up.  It will be important for her to keep in contact with genetics to keep up with whether additional testing may be needed.  

## 2020-11-07 NOTE — Progress Notes (Signed)
HPI:  Lisa Lynn was previously seen in the  Cancer Genetics clinic due to a family history of breast cancer and concerns regarding a hereditary predisposition to cancer. Please refer to our prior cancer genetics clinic note for more information regarding our discussion, assessment and recommendations, at the time. Lisa Lynn recent genetic test results were disclosed to her, as were recommendations warranted by these results. These results and recommendations are discussed in more detail below.  CANCER HISTORY:  Oncology History   No history exists.    FAMILY HISTORY:  We obtained a detailed, 4-generation family history.  Significant diagnoses are listed below: Family History  Problem Relation Age of Onset  . Hypertension Mother   . Breast cancer Mother 29  . Heart disease Father   . Stroke Maternal Grandmother   . Stroke Paternal Grandmother   . Heart attack Paternal Grandfather   . Breast cancer Maternal Aunt 51  . Testicular cancer Paternal Uncle 36  . Breast cancer Other        MGMs sister dx in early 13s    The patient has a son and daughter who are cancer free.  She has a brother and sister who are cancer free.  Both parents are living.  The patient's mother had breast cancer at 65.  She took tamoxifen after her diagnosis, suggesting that this was an ER+ breast cancer.  Her mother had a brother and sister.  The sister had breast cancer at 84 and died of metastatic breast cancer.  The maternal grandparents are deceased.  The grandmother had a stroke and the grandfather died in his 35's from an accident.  The grandmother has a sister who had metastatic breast cancer.  The patient's father is cancer free. He had a brother who died of testicular cancer at 40.  His mother died of pneumonia and his father died of a heart attack.  Lisa Lynn is unaware of previous family history of genetic testing for hereditary cancer risks. Patient's maternal ancestors are of [place]  descent, and paternal ancestors are of [place] descent. There is no reported Ashkenazi Jewish ancestry. There is no known consanguinity.    GENETIC TEST RESULTS: Genetic testing reported out on November 03, 2020 through the CancerNext-Expanded+RNAinsight cancer panel found no pathogenic mutations. The CancerNext-Expanded gene panel offered by [facility] and includes sequencing and rearrangement analysis for the following 77 genes: AIP, ALK, APC*, ATM*, AXIN2, BAP1, BARD1, BLM, BMPR1A, BRCA1*, BRCA2*, BRIP1*, CDC73, CDH1*, CDK4, CDKN1B, CDKN2A, CHEK2*, CTNNA1, DICER1, FANCC, FH, FLCN, GALNT12, KIF1B, LZTR1, MAX, MEN1, MET, MLH1*, MSH2*, MSH3, MSH6*, MUTYH*, NBN, NF1*, NF2, NTHL1, PALB2*, PHOX2B, PMS2*, POT1, PRKAR1A, PTCH1, PTEN*, RAD51C*, RAD51D*, RB1, RECQL, RET, SDHA, SDHAF2, SDHB, SDHC, SDHD, SMAD4, SMARCA4, SMARCB1, SMARCE1, STK11, SUFU, TMEM127, TP53*, TSC1, TSC2, VHL and XRCC2 (sequencing and deletion/duplication); EGFR, EGLN1, HOXB13, KIT, MITF, PDGFRA, POLD1, and POLE (sequencing only); EPCAM and GREM1 (deletion/duplication only). DNA and RNA analyses performed for * genes. The test report has been scanned into EPIC and is located under the Molecular Pathology section of the Results Review tab.  A portion of the result report is included below for reference.     We discussed with Lisa Lynn that because current genetic testing is not perfect, it is possible there may be a gene mutation in one of these genes that current testing cannot detect, but that chance is small.  We also discussed, that there could be another gene that has not yet been discovered, or that we have not  yet tested, that is responsible for the cancer diagnoses in the family. It is also possible there is a hereditary cause for the cancer in the family that Lisa Lynn did not inherit and therefore was not identified in her testing.  Therefore, it is important to remain in touch with cancer genetics in the future so that we can  continue to offer Lisa Lynn the most up to date genetic testing.   ADDITIONAL GENETIC TESTING: We discussed with Lisa Lynn that her genetic testing was fairly extensive.  If there are genes identified to increase cancer risk that can be analyzed in the future, we would be happy to discuss and coordinate this testing at that time.    CANCER SCREENING RECOMMENDATIONS: Lisa Lynn test result is considered negative (normal).  This means that we have not identified a hereditary cause for her family history of breast cancer at this time. Most cancers happen by chance and this negative test suggests that her cancer may fall into this category.    While reassuring, this does not definitively rule out a hereditary predisposition to cancer. It is still possible that there could be genetic mutations that are undetectable by current technology. There could be genetic mutations in genes that have not been tested or identified to increase cancer risk.  Therefore, it is recommended she continue to follow the cancer management and screening guidelines provided by her primary healthcare provider.   An individual's cancer risk and medical management are not determined by genetic test results alone. Overall cancer risk assessment incorporates additional factors, including personal medical history, family history, and any available genetic information that may result in a personalized plan for cancer prevention and surveillance  RECOMMENDATIONS FOR FAMILY MEMBERS:  Individuals in this family might be at some increased risk of developing cancer, over the general population risk, simply due to the family history of cancer.  We recommended women in this family have a yearly mammogram beginning at age 63, or 31 years younger than the earliest onset of cancer, an annual clinical breast exam, and perform monthly breast self-exams. Women in this family should also have a gynecological exam as recommended by their primary  provider. All family members should be referred for colonoscopy starting at age 11.  FOLLOW-UP: Lastly, we discussed with Lisa Lynn that cancer genetics is a rapidly advancing field and it is possible that new genetic tests will be appropriate for her and/or her family members in the future. We encouraged her to remain in contact with cancer genetics on an annual basis so we can update her personal and family histories and let her know of advances in cancer genetics that may benefit this family.   Our contact number was provided. Lisa Lynn questions were answered to her satisfaction, and she knows she is welcome to call us at anytime with additional questions or concerns.   Roma Kayser, [place], [facility] Licensed, Certified Genetic Counselor [email or web address]

## 2020-12-06 DIAGNOSIS — Z9889 Other specified postprocedural states: Secondary | ICD-10-CM | POA: Diagnosis not present

## 2021-01-22 DIAGNOSIS — Z03818 Encounter for observation for suspected exposure to other biological agents ruled out: Secondary | ICD-10-CM | POA: Diagnosis not present

## 2021-01-22 DIAGNOSIS — Z20822 Contact with and (suspected) exposure to covid-19: Secondary | ICD-10-CM | POA: Diagnosis not present

## 2021-03-08 DIAGNOSIS — Z20822 Contact with and (suspected) exposure to covid-19: Secondary | ICD-10-CM | POA: Diagnosis not present

## 2021-05-11 DIAGNOSIS — L218 Other seborrheic dermatitis: Secondary | ICD-10-CM | POA: Diagnosis not present

## 2021-05-31 ENCOUNTER — Telehealth: Payer: Self-pay | Admitting: Internal Medicine

## 2021-05-31 DIAGNOSIS — I341 Nonrheumatic mitral (valve) prolapse: Secondary | ICD-10-CM

## 2021-05-31 DIAGNOSIS — Q231 Congenital insufficiency of aortic valve: Secondary | ICD-10-CM

## 2021-05-31 NOTE — Telephone Encounter (Signed)
Spoke with patient of Dr. Jacques Navy -- she reports chest pain, shortness of breath, sweating. This has been going on for about 2 weeks, off and on. She reports occasional heaviness in her chest. She reports 1 episode about 2.5 weeks ago where she had pain in chest on left side, down arm/bicep - described as a soreness, like she had been working out. She reports no triggers, no stress. She has shortness of breath with exercise. She has no vitals.   She has an appointment on 8/2 with Catilin NP but scheduled an appointment with Dr. Izora Ribas on 06/05/21 DOD schedule.   Sent her a MyChart message with appointment info/ED precautions.

## 2021-05-31 NOTE — Telephone Encounter (Signed)
Pt c/o of Chest Pain: STAT if CP now or developed within 24 hours  1. Are you having CP right now? no  2. Are you experiencing any other symptoms (ex. SOB, nausea, vomiting, sweating)? Sob, sweating  3. How long have you been experiencing CP? 2 weeks  4. Is your CP continuous or coming and going?  Coming and going  5. Have you taken Nitroglycerin? no ?

## 2021-06-01 ENCOUNTER — Ambulatory Visit (HOSPITAL_COMMUNITY)
Admission: RE | Admit: 2021-06-01 | Discharge: 2021-06-01 | Disposition: A | Discharge status: Home / Self Care | Payer: BC Managed Care – PPO | Source: Ambulatory Visit | Attending: Internal Medicine | Admitting: Internal Medicine

## 2021-06-01 ENCOUNTER — Other Ambulatory Visit: Payer: Self-pay

## 2021-06-01 DIAGNOSIS — Q2381 Bicuspid aortic valve: Secondary | ICD-10-CM

## 2021-06-01 DIAGNOSIS — Q231 Congenital insufficiency of aortic valve: Secondary | ICD-10-CM | POA: Diagnosis not present

## 2021-06-01 DIAGNOSIS — I341 Nonrheumatic mitral (valve) prolapse: Secondary | ICD-10-CM | POA: Diagnosis not present

## 2021-06-01 LAB — ECHOCARDIOGRAM COMPLETE
AR max vel: 1.68 cm2
AV Area VTI: 1.47 cm2
AV Area mean vel: 1.58 cm2
AV Mean grad: 7 mmHg
AV Peak grad: 11.2 mmHg
Ao pk vel: 1.67 m/s
Area-P 1/2: 3.34 cm2
P 1/2 time: 413 msec
S' Lateral: 4.3 cm

## 2021-06-01 NOTE — Telephone Encounter (Signed)
Per Conversation with Dr. Jacques Navy- Dr. Jacques Navy would like for patient to have Echo prior to appointment with Dr. Salena Saner on 7/19.   Order has been placed, patient is aware that someone from scheduling will reach out to her to get her scheduled. Patient verbalized understanding.

## 2021-06-01 NOTE — Addendum Note (Signed)
Addended by: Bea Laura B on: 06/01/2021 10:06 AM   Modules accepted: Orders

## 2021-06-05 ENCOUNTER — Other Ambulatory Visit: Payer: Self-pay

## 2021-06-05 ENCOUNTER — Telehealth (INDEPENDENT_AMBULATORY_CARE_PROVIDER_SITE_OTHER): Payer: BC Managed Care – PPO | Admitting: Internal Medicine

## 2021-06-05 ENCOUNTER — Telehealth: Payer: Self-pay

## 2021-06-05 ENCOUNTER — Encounter: Payer: Self-pay | Admitting: Internal Medicine

## 2021-06-05 ENCOUNTER — Telehealth: Payer: BC Managed Care – PPO | Admitting: Internal Medicine

## 2021-06-05 VITALS — Ht 65.0 in | Wt 155.0 lb

## 2021-06-05 DIAGNOSIS — F411 Generalized anxiety disorder: Secondary | ICD-10-CM | POA: Diagnosis not present

## 2021-06-05 DIAGNOSIS — G4700 Insomnia, unspecified: Secondary | ICD-10-CM | POA: Diagnosis not present

## 2021-06-05 DIAGNOSIS — R072 Precordial pain: Secondary | ICD-10-CM | POA: Diagnosis not present

## 2021-06-05 DIAGNOSIS — E559 Vitamin D deficiency, unspecified: Secondary | ICD-10-CM | POA: Diagnosis not present

## 2021-06-05 DIAGNOSIS — I351 Nonrheumatic aortic (valve) insufficiency: Secondary | ICD-10-CM | POA: Diagnosis not present

## 2021-06-05 DIAGNOSIS — Q231 Congenital insufficiency of aortic valve: Secondary | ICD-10-CM | POA: Diagnosis not present

## 2021-06-05 DIAGNOSIS — F331 Major depressive disorder, recurrent, moderate: Secondary | ICD-10-CM | POA: Diagnosis not present

## 2021-06-05 NOTE — Patient Instructions (Addendum)
Medication Instructions:  Your physician recommends that you continue on your current medications as directed. Please refer to the Current Medication list given to you today.  *If you need a refill on your cardiac medications before your next appointment, please call your pharmacy*   Lab Work: NONE If you have labs (blood work) drawn today and your tests are completely normal, you will receive your results only by: MyChart Message (if you have MyChart) OR A paper copy in the mail If you have any lab test that is abnormal or we need to change your treatment, we will call you to review the results.   Testing/Procedures: Your physician has requested that you have cardiac CT. Cardiac computed tomography (CT) is a painless test that uses an x-ray machine to take clear, detailed pictures of your heart. Please follow instruction sheet as given.     Follow-Up:   09/18/21 with Dr. Jacques Navy    Other Instructions   Your cardiac CT will be scheduled at one of the below locations:   [facility] [address] [place], [place] [address] [phone]   at [facility], please arrive at the [facility] main entrance (entrance A) of [facility] 30 minutes prior to test start time. Proceed to the [facility] Radiology Department (first floor) to check-in and test prep.   Please follow these instructions carefully (unless otherwise directed):   On the Night Before the Test: Be sure to Drink plenty of water. Do not consume any caffeinated/decaffeinated beverages or chocolate 12 hours prior to your test. Do not take any antihistamines 12 hours prior to your test.   On the Day of the Test: Drink plenty of water until 1 hour prior to the test. Do not eat any food 4 hours prior to the test. You may take your regular medications prior to the test.  Take metoprolol (Lopressor) (DOSE TO BE DETERMINED AFTER YOU SEND IN HEART RATE/ PULSE READINGS ) two hours prior to  test. FEMALES- please wear underwire-free bra if available, avoid dresses & tight clothing   After the Test: Drink plenty of water. After receiving IV contrast, you may experience a mild flushed feeling. This is normal. On occasion, you may experience a mild rash up to 24 hours after the test. This is not dangerous. If this occurs, you can take Benadryl 25 mg and increase your fluid intake. If you experience trouble breathing, this can be serious. If it is severe call 911 IMMEDIATELY. If it is mild, please call our office. If you take any of these medications: Glipizide/Metformin, Avandament, Glucavance, please do not take 48 hours after completing test unless otherwise instructed.  Please allow 2-4 weeks for scheduling of routine cardiac CTs. Some insurance companies require a pre-authorization which may delay scheduling of this test.   For non-scheduling related questions, please contact the cardiac imaging nurse navigator should you have any questions/concerns: [name], Cardiac Imaging Nurse Navigator Larey Brick, Cardiac Imaging Nurse Navigator [facility] Direct Office Dial: [phone]   For scheduling needs, including cancellations and rescheduling, please call [place], [phone].

## 2021-06-05 NOTE — Progress Notes (Signed)
Cardiology Office Note:    Date:  06/05/2021   ID:  Lisa Lynn, DOB [birth year], MRN [id number]  PCP:  Lewis Moccasin, MD   [facility] Providers Cardiologist:  None     Referring MD: Lewis Moccasin, MD   CC: Chest pain  I connected with  Lisa Lynn on 06/05/21 by a video enabled telemedicine application and verified that I am speaking with the correct person using two identifiers.   I discussed the limitations of evaluation and management by telemedicine. The patient expressed understanding and agreed to proceed. Patient:Home Provider: Home Video the entire visit Time: 14 minutes  History of Present Illness:    Lisa Lynn is a 60 y.o. female with a hx of BAV and hypothyroidism who presents virtually 06/05/21.  Patient notes that she has been doing ok. Since last visit has had chest pressure that radiated to her arm.  This lasted for 1/2 hour.  Wasn't doing anything during this- was watching TV.  Walks in the neighborhood and has a Radio broadcast assistant bike over.  This did not bring on any symptoms (has DOE with her biking class).  No breathing issues other. No weight gain or leg swelling.  No syncope .   Past Medical History:  Diagnosis Date   Family history of breast cancer    Heart murmur    Thyroid disease    hypothyroid    Past Surgical History:  Procedure Laterality Date   BACK SURGERY  2010   SPINE SURGERY      Current Medications: Current Meds  Medication Sig   thyroid (ARMOUR) 60 MG tablet Take 60 mg by mouth daily before breakfast.   UBRELVY 100 MG TABS Take by mouth.     Allergies:   Patient has no known allergies.   Social History   Socioeconomic History   Marital status: Married    Spouse name: Not on file   Number of children: Not on file   Years of education: Not on file   Highest education level: Not on file  Occupational History   Not on file  Tobacco Use   Smoking status: Former   Smokeless tobacco: Never    Tobacco comments:    teenager  Substance and Sexual Activity   Alcohol use: Yes    Comment: 2-4 drinks a week   Drug use: No   Sexual activity: Yes    Birth control/protection: Post-menopausal  Other Topics Concern   Not on file  Social History Narrative   Not on file   Social Determinants of Health   Financial Resource Strain: Not on file  Food Insecurity: Not on file  Transportation Needs: Not on file  Physical Activity: Not on file  Stress: Not on file  Social Connections: Not on file     Family History: The patient's family history includes Breast cancer in an other family member; Breast cancer (age of onset: 61) in her maternal aunt; Breast cancer (age of onset: 97) in her mother; Heart attack in her paternal grandfather; Heart disease in her father; Hypertension in her mother; Stroke in her maternal grandmother and paternal grandmother; Testicular cancer (age of onset: 41) in her paternal uncle. CHF and Valve replacement (TAVI).  No aortic dissection.   ROS:   Please see the history of present illness.    All other systems reviewed and are negative.  EKGs/Labs/Other Studies Reviewed:    The following studies were reviewed today:  Transthoracic Echocardiogram: Date:06/01/21 Results:  1.  The aortic valve is bicuspid. Aortic valve regurgitation is mild to  moderate. No aortic stenosis is present. Aortic regurgitation PHT measures  413 msec. Aortic valve mean gradient measures 7.0 mmHg. No spectral  Doppler evidence of coarctation of the  aorta.   2. Left ventricular ejection fraction, by estimation, is 50 to 55%. Left  ventricular ejection fraction by 3D volume is 49 %. The left ventricle has  low normal function. The left ventricle has no regional wall motion  abnormalities. Left ventricular  diastolic parameters were normal.   3. Right ventricular systolic function is normal. The right ventricular  size is normal. Tricuspid regurgitation signal is inadequate for  assessing  PA pressure.   4. The mitral valve is normal in structure. No evidence of mitral valve  regurgitation. No evidence of mitral stenosis.   5. The inferior vena cava is normal in size with greater than 50%  respiratory variability, suggesting right atrial pressure of 3 mmHg.   Comparison(s): A prior study was performed on 10/23/2020. Prior images  reviewed side by side. Similar aortic regurgitation, LV function is  slightly less vigorous.   Recent Labs: No results found for requested labs within last 8760 hours.  Recent Lipid Panel    Component Value Date/Time   CHOL 205 (H) 10/25/2015 0922   TRIG 56 10/25/2015 0922   HDL 78 10/25/2015 0922   CHOLHDL 2.6 10/25/2015 0922   VLDL 11 10/25/2015 0922   LDLCALC 116 10/25/2015 0922      Physical Exam:    VS:  Ht 5\' 5"  (1.651 m)   Wt 70.3 kg   BMI 25.79 kg/m     Wt Readings from Last 3 Encounters:  06/05/21 70.3 kg  10/27/20 70.3 kg  09/27/20 73 kg    Patient notes that she is unable to check her pulse secondary to her Apple Watch being uncharged.  GEN:  Well nourished, well developed in no acute distress RESPIRATORY:  No tachypnea SKIN: Warm without visual rash NEUROLOGIC:  Alert and oriented x 3 PSYCHIATRIC:  Normal affect   ASSESSMENT:    1. Precordial pain   2. Bicuspid aortic valve   3. Nonrheumatic aortic valve insufficiency    PLAN:    Precordial Chest Pain BAV- Mild to moderate AI w/o stenosis, w/o FHX of SCD (father s/p TAVR) HLD (last LDL in 2016) - The patient presents with possibly cardiac CP - discussed options of watchful waiting (given lack of sx with exercise) vs proceeding to testing and also looking at ascending aortic size/root size - Would recommend CCTA with possible FFR as needed to exclude obstructive CAD and to assess for non-obstructive CAD requiring secondary prevention - patient will assess her pulse with her Apple watch and let 2017 know resting heart rate prior to test as she may  need BB one hour prior  Will keep 11/22 f/u with primary MD unless new issues occur   Medication Adjustments/Labs and Tests Ordered: Current medicines are reviewed at length with the patient today.  Concerns regarding medicines are outlined above.  Orders Placed This Encounter  Procedures   CT CORONARY MORPH W/CTA COR W/SCORE W/CA W/CM &/OR WO/CM    No orders of the defined types were placed in this encounter.   Patient Instructions  Medication Instructions:  Your physician recommends that you continue on your current medications as directed. Please refer to the Current Medication list given to you today.  *If you need a refill on your cardiac medications before your  next appointment, please call your pharmacy*   Lab Work: NONE If you have labs (blood work) drawn today and your tests are completely normal, you will receive your results only by: MyChart Message (if you have MyChart) OR A paper copy in the mail If you have any lab test that is abnormal or we need to change your treatment, we will call you to review the results.   Testing/Procedures: Your physician has requested that you have cardiac CT. Cardiac computed tomography (CT) is a painless test that uses an x-ray machine to take clear, detailed pictures of your heart. Please follow instruction sheet as given.     Follow-Up:   09/18/21 with Dr. Jacques Navy    Other Instructions   Your cardiac CT will be scheduled at one of the below locations:   [facility] [address] [place], [place] [address] [phone]   at [facility], please arrive at the [facility] main entrance (entrance A) of [facility] 30 minutes prior to test start time. Proceed to the [facility] Radiology Department (first floor) to check-in and test prep.   Please follow these instructions carefully (unless otherwise directed):   On the Night Before the Test: Be sure to Drink plenty of water. Do not  consume any caffeinated/decaffeinated beverages or chocolate 12 hours prior to your test. Do not take any antihistamines 12 hours prior to your test.   On the Day of the Test: Drink plenty of water until 1 hour prior to the test. Do not eat any food 4 hours prior to the test. You may take your regular medications prior to the test.  Take metoprolol (Lopressor) (DOSE TO BE DETERMINED AFTER YOU SEND IN HEART RATE/ PULSE READINGS ) two hours prior to test. FEMALES- please wear underwire-free bra if available, avoid dresses & tight clothing   After the Test: Drink plenty of water. After receiving IV contrast, you may experience a mild flushed feeling. This is normal. On occasion, you may experience a mild rash up to 24 hours after the test. This is not dangerous. If this occurs, you can take Benadryl 25 mg and increase your fluid intake. If you experience trouble breathing, this can be serious. If it is severe call 911 IMMEDIATELY. If it is mild, please call our office. If you take any of these medications: Glipizide/Metformin, Avandament, Glucavance, please do not take 48 hours after completing test unless otherwise instructed.  Please allow 2-4 weeks for scheduling of routine cardiac CTs. Some insurance companies require a pre-authorization which may delay scheduling of this test.   For non-scheduling related questions, please contact the cardiac imaging nurse navigator should you have any questions/concerns: [name], Cardiac Imaging Nurse Navigator Larey Brick, Cardiac Imaging Nurse Navigator [facility] Direct Office Dial: [phone]   For scheduling needs, including cancellations and rescheduling, please call [place], [phone].     Signed, Christell Constant, MD  06/05/2021 2:43 PM    [facility]

## 2021-06-05 NOTE — Telephone Encounter (Signed)
  Patient Consent for Virtual Visit        Lisa Lynn has provided verbal consent on 06/05/2021 for a virtual visit (video or telephone).   CONSENT FOR VIRTUAL VISIT FOR:  Lisa Lynn  By participating in this virtual visit I agree to the following:  I hereby voluntarily request, consent and authorize [facility] and its employed or contracted physicians, physician assistants, nurse practitioners or other licensed health care professionals (the Practitioner), to provide me with telemedicine health care services (the "Services") as deemed necessary by the treating Practitioner. I acknowledge and consent to receive the Services by the Practitioner via telemedicine. I understand that the telemedicine visit will involve communicating with the Practitioner through live audiovisual communication technology and the disclosure of certain medical information by electronic transmission. I acknowledge that I have been given the opportunity to request an in-person assessment or other available alternative prior to the telemedicine visit and am voluntarily participating in the telemedicine visit.  I understand that I have the right to withhold or withdraw my consent to the use of telemedicine in the course of my care at any time, without affecting my right to future care or treatment, and that the Practitioner or I may terminate the telemedicine visit at any time. I understand that I have the right to inspect all information obtained and/or recorded in the course of the telemedicine visit and may receive copies of available information for a reasonable fee.  I understand that some of the potential risks of receiving the Services via telemedicine include:  Delay or interruption in medical evaluation due to technological equipment failure or disruption; Information transmitted may not be sufficient (e.g. poor resolution of images) to allow for appropriate medical decision making by the  Practitioner; and/or  In rare instances, security protocols could fail, causing a breach of personal health information.  Furthermore, I acknowledge that it is my responsibility to provide information about my medical history, conditions and care that is complete and accurate to the best of my ability. I acknowledge that Practitioner's advice, recommendations, and/or decision may be based on factors not within their control, such as incomplete or inaccurate data provided by me or distortions of diagnostic images or specimens that may result from electronic transmissions. I understand that the practice of medicine is not an exact science and that Practitioner makes no warranties or guarantees regarding treatment outcomes. I acknowledge that a copy of this consent can be made available to me via my patient portal [facility] MyChart), or I can request a printed copy by calling the office of [facility].    I understand that my insurance will be billed for this visit.   I have read or had this consent read to me. I understand the contents of this consent, which adequately explains the benefits and risks of the Services being provided via telemedicine.  I have been provided ample opportunity to ask questions regarding this consent and the Services and have had my questions answered to my satisfaction. I give my informed consent for the services to be provided through the use of telemedicine in my medical care

## 2021-06-06 ENCOUNTER — Telehealth: Payer: Self-pay

## 2021-06-06 DIAGNOSIS — R072 Precordial pain: Secondary | ICD-10-CM

## 2021-06-06 DIAGNOSIS — Q231 Congenital insufficiency of aortic valve: Secondary | ICD-10-CM

## 2021-06-06 DIAGNOSIS — Q2381 Bicuspid aortic valve: Secondary | ICD-10-CM

## 2021-06-06 NOTE — Telephone Encounter (Signed)
-----   Message from [place] sent at 06/06/2021  9:35 AM EDT ----- Regarding: ct heart Scheduled 06/13/21 at 1:45    She will need labs done for ct scan.   Thanks, [place]

## 2021-06-06 NOTE — Telephone Encounter (Signed)
Left a detailed message for pt (OK per DPR).  I asked her to send in a my chart message or call the office to let me know what day she would like to come in for lab work.  Lab work is due prior to 06/13/21 Cardiac CT.

## 2021-06-07 NOTE — Telephone Encounter (Signed)
Pt is returning call from yesterday. Please advise pt further 

## 2021-06-07 NOTE — Telephone Encounter (Signed)
Returned call to patient. Scheduled lab appointment on Monday 7/25 for bmet prior to coronary CT. The patient is aware of our [facility]. Location and verbalized agreement with plan. She thanked me for the call.

## 2021-06-11 ENCOUNTER — Other Ambulatory Visit: Payer: BC Managed Care – PPO

## 2021-06-11 ENCOUNTER — Other Ambulatory Visit: Payer: BC Managed Care – PPO | Admitting: *Deleted

## 2021-06-11 ENCOUNTER — Other Ambulatory Visit: Payer: Self-pay

## 2021-06-11 DIAGNOSIS — Q231 Congenital insufficiency of aortic valve: Secondary | ICD-10-CM | POA: Diagnosis not present

## 2021-06-11 DIAGNOSIS — R072 Precordial pain: Secondary | ICD-10-CM

## 2021-06-11 LAB — BASIC METABOLIC PANEL
BUN/Creatinine Ratio: 21 (ref 12–28)
BUN: 16 mg/dL (ref 8–27)
CO2: 25 mmol/L (ref 20–29)
Calcium: 9.4 mg/dL (ref 8.7–10.3)
Chloride: 103 mmol/L (ref 96–106)
Creatinine, Ser: 0.78 mg/dL (ref 0.57–1.00)
Glucose: 127 mg/dL — ABNORMAL HIGH (ref 65–99)
Potassium: 4.1 mmol/L (ref 3.5–5.2)
Sodium: 142 mmol/L (ref 134–144)
eGFR: 87 mL/min/{1.73_m2} (ref >59)

## 2021-06-12 ENCOUNTER — Other Ambulatory Visit: Payer: BC Managed Care – PPO

## 2021-06-13 ENCOUNTER — Ambulatory Visit (HOSPITAL_COMMUNITY): Payer: BC Managed Care – PPO

## 2021-06-15 ENCOUNTER — Telehealth (HOSPITAL_COMMUNITY): Payer: Self-pay | Admitting: Emergency Medicine

## 2021-06-15 DIAGNOSIS — R072 Precordial pain: Secondary | ICD-10-CM

## 2021-06-15 MED ORDER — METOPROLOL TARTRATE 25 MG PO TABS: 25.0000 mg | ORAL_TABLET | Freq: Once | ORAL | 0 refills | Status: DC

## 2021-06-15 NOTE — Telephone Encounter (Signed)
Reaching out to patient to offer assistance regarding upcoming cardiac imaging study; pt verbalizes understanding of appt date/time, parking situation and where to check in, pre-test NPO status and medications ordered, and verified current allergies; name and call back number provided for further questions should they arise [name] RN Navigator Cardiac Imaging [name] [phone] office [phone] cell  Denies iv issues Some claustro 25mg  metoprolol tart 2 hr prior to scan (pt reports apple watch avg HR 65-68bpm) Lisa Lynn

## 2021-06-19 ENCOUNTER — Ambulatory Visit (HOSPITAL_BASED_OUTPATIENT_CLINIC_OR_DEPARTMENT_OTHER): Payer: BC Managed Care – PPO | Admitting: Family

## 2021-06-20 ENCOUNTER — Other Ambulatory Visit: Payer: Self-pay

## 2021-06-20 ENCOUNTER — Ambulatory Visit (HOSPITAL_COMMUNITY)
Admission: RE | Admit: 2021-06-20 | Discharge: 2021-06-20 | Disposition: A | Discharge status: Home / Self Care | Payer: BC Managed Care – PPO | Source: Ambulatory Visit | Attending: Internal Medicine | Admitting: Internal Medicine

## 2021-06-20 DIAGNOSIS — R072 Precordial pain: Secondary | ICD-10-CM | POA: Diagnosis not present

## 2021-06-20 MED ORDER — NITROGLYCERIN 0.4 MG SL SUBL
0.8000 mg | SUBLINGUAL_TABLET | Freq: Once | SUBLINGUAL | Status: AC
  Administered 2021-06-20: 0.8 mg via SUBLINGUAL

## 2021-06-20 MED ORDER — NITROGLYCERIN 0.4 MG SL SUBL
SUBLINGUAL_TABLET | SUBLINGUAL | Status: AC
  Filled 2021-06-20: qty 2

## 2021-06-20 MED ORDER — IOHEXOL 350 MG/ML SOLN
95.0000 mL | Freq: Once | INTRAVENOUS | Status: AC | PRN
  Administered 2021-06-20: 95 mL via INTRAVENOUS

## 2021-07-04 DIAGNOSIS — E039 Hypothyroidism, unspecified: Secondary | ICD-10-CM | POA: Diagnosis not present

## 2021-07-04 DIAGNOSIS — E041 Nontoxic single thyroid nodule: Secondary | ICD-10-CM | POA: Diagnosis not present

## 2021-07-04 DIAGNOSIS — E782 Mixed hyperlipidemia: Secondary | ICD-10-CM | POA: Diagnosis not present

## 2021-07-04 DIAGNOSIS — E559 Vitamin D deficiency, unspecified: Secondary | ICD-10-CM | POA: Diagnosis not present

## 2021-07-09 DIAGNOSIS — E782 Mixed hyperlipidemia: Secondary | ICD-10-CM | POA: Diagnosis not present

## 2021-07-09 DIAGNOSIS — E039 Hypothyroidism, unspecified: Secondary | ICD-10-CM | POA: Diagnosis not present

## 2021-07-09 DIAGNOSIS — F331 Major depressive disorder, recurrent, moderate: Secondary | ICD-10-CM | POA: Diagnosis not present

## 2021-07-09 DIAGNOSIS — F411 Generalized anxiety disorder: Secondary | ICD-10-CM | POA: Diagnosis not present

## 2021-08-14 DIAGNOSIS — Z23 Encounter for immunization: Secondary | ICD-10-CM | POA: Diagnosis not present

## 2021-08-29 LAB — HM MAMMOGRAPHY

## 2021-09-04 DIAGNOSIS — U071 COVID-19: Secondary | ICD-10-CM | POA: Diagnosis not present

## 2021-09-04 DIAGNOSIS — J40 Bronchitis, not specified as acute or chronic: Secondary | ICD-10-CM | POA: Diagnosis not present

## 2021-09-06 DIAGNOSIS — U071 COVID-19: Secondary | ICD-10-CM | POA: Diagnosis not present

## 2021-09-06 DIAGNOSIS — J40 Bronchitis, not specified as acute or chronic: Secondary | ICD-10-CM | POA: Diagnosis not present

## 2021-09-18 ENCOUNTER — Ambulatory Visit: Payer: BC Managed Care – PPO | Admitting: Internal Medicine

## 2021-10-02 DIAGNOSIS — Z130 Encounter for screening for diseases of the blood and blood-forming organs and certain disorders involving the immune mechanism: Secondary | ICD-10-CM | POA: Diagnosis not present

## 2021-10-02 DIAGNOSIS — Z1231 Encounter for screening mammogram for malignant neoplasm of breast: Secondary | ICD-10-CM | POA: Diagnosis not present

## 2021-10-02 DIAGNOSIS — Z6827 Body mass index (BMI) 27.0-27.9, adult: Secondary | ICD-10-CM | POA: Diagnosis not present

## 2021-10-02 DIAGNOSIS — Z01419 Encounter for gynecological examination (general) (routine) without abnormal findings: Secondary | ICD-10-CM | POA: Diagnosis not present

## 2021-10-02 DIAGNOSIS — Z1389 Encounter for screening for other disorder: Secondary | ICD-10-CM | POA: Diagnosis not present

## 2021-10-17 DIAGNOSIS — M5416 Radiculopathy, lumbar region: Secondary | ICD-10-CM | POA: Diagnosis not present

## 2021-12-12 DIAGNOSIS — M1711 Unilateral primary osteoarthritis, right knee: Secondary | ICD-10-CM | POA: Diagnosis not present

## 2021-12-18 DIAGNOSIS — M5416 Radiculopathy, lumbar region: Secondary | ICD-10-CM | POA: Diagnosis not present

## 2021-12-25 ENCOUNTER — Ambulatory Visit: Payer: BC Managed Care – PPO | Admitting: Internal Medicine

## 2022-01-16 DIAGNOSIS — M5416 Radiculopathy, lumbar region: Secondary | ICD-10-CM | POA: Diagnosis not present

## 2022-02-04 DIAGNOSIS — N952 Postmenopausal atrophic vaginitis: Secondary | ICD-10-CM | POA: Diagnosis not present

## 2022-02-04 DIAGNOSIS — N9089 Other specified noninflammatory disorders of vulva and perineum: Secondary | ICD-10-CM | POA: Diagnosis not present

## 2022-02-04 DIAGNOSIS — N951 Menopausal and female climacteric states: Secondary | ICD-10-CM | POA: Diagnosis not present

## 2022-04-16 DIAGNOSIS — N951 Menopausal and female climacteric states: Secondary | ICD-10-CM | POA: Diagnosis not present

## 2022-04-16 DIAGNOSIS — E079 Disorder of thyroid, unspecified: Secondary | ICD-10-CM | POA: Diagnosis not present

## 2022-06-03 DIAGNOSIS — F331 Major depressive disorder, recurrent, moderate: Secondary | ICD-10-CM | POA: Diagnosis not present

## 2022-06-03 DIAGNOSIS — G4700 Insomnia, unspecified: Secondary | ICD-10-CM | POA: Diagnosis not present

## 2022-06-03 DIAGNOSIS — F411 Generalized anxiety disorder: Secondary | ICD-10-CM | POA: Diagnosis not present

## 2022-06-04 DIAGNOSIS — D485 Neoplasm of uncertain behavior of skin: Secondary | ICD-10-CM | POA: Diagnosis not present

## 2022-06-04 DIAGNOSIS — L814 Other melanin hyperpigmentation: Secondary | ICD-10-CM | POA: Diagnosis not present

## 2022-06-04 DIAGNOSIS — D2239 Melanocytic nevi of other parts of face: Secondary | ICD-10-CM | POA: Diagnosis not present

## 2022-06-04 DIAGNOSIS — L821 Other seborrheic keratosis: Secondary | ICD-10-CM | POA: Diagnosis not present

## 2022-06-04 DIAGNOSIS — L819 Disorder of pigmentation, unspecified: Secondary | ICD-10-CM | POA: Diagnosis not present

## 2022-06-04 DIAGNOSIS — L218 Other seborrheic dermatitis: Secondary | ICD-10-CM | POA: Diagnosis not present

## 2022-07-03 ENCOUNTER — Telehealth: Payer: Self-pay

## 2022-07-03 ENCOUNTER — Encounter: Payer: Self-pay | Admitting: Internal Medicine

## 2022-07-03 ENCOUNTER — Ambulatory Visit (INDEPENDENT_AMBULATORY_CARE_PROVIDER_SITE_OTHER): Payer: BC Managed Care – PPO | Admitting: Internal Medicine

## 2022-07-03 VITALS — BP 160/78 | HR 82 | Temp 98.2°F | Resp 18 | Ht 65.0 in | Wt 167.0 lb

## 2022-07-03 DIAGNOSIS — F332 Major depressive disorder, recurrent severe without psychotic features: Secondary | ICD-10-CM

## 2022-07-03 DIAGNOSIS — R739 Hyperglycemia, unspecified: Secondary | ICD-10-CM

## 2022-07-03 DIAGNOSIS — E039 Hypothyroidism, unspecified: Secondary | ICD-10-CM | POA: Diagnosis not present

## 2022-07-03 DIAGNOSIS — Z0001 Encounter for general adult medical examination with abnormal findings: Secondary | ICD-10-CM

## 2022-07-03 DIAGNOSIS — Z1159 Encounter for screening for other viral diseases: Secondary | ICD-10-CM | POA: Diagnosis not present

## 2022-07-03 DIAGNOSIS — I10 Essential (primary) hypertension: Secondary | ICD-10-CM | POA: Diagnosis not present

## 2022-07-03 DIAGNOSIS — K635 Polyp of colon: Secondary | ICD-10-CM

## 2022-07-03 LAB — URINALYSIS, ROUTINE W REFLEX MICROSCOPIC
Bilirubin Urine: NEGATIVE
Hgb urine dipstick: NEGATIVE
Ketones, ur: NEGATIVE
Leukocytes,Ua: NEGATIVE
Nitrite: NEGATIVE
RBC / HPF: NONE SEEN (ref 0–?)
Specific Gravity, Urine: <=1.005 — AB (ref 1.000–1.030)
Total Protein, Urine: NEGATIVE
Urine Glucose: NEGATIVE
Urobilinogen, UA: 0.2 (ref 0.0–1.0)
WBC, UA: NONE SEEN (ref 0–?)
pH: 6 (ref 5.0–8.0)

## 2022-07-03 LAB — CBC WITH DIFFERENTIAL/PLATELET
Basophils Absolute: 0.1 K/uL (ref 0.0–0.1)
Basophils Relative: 1.3 % (ref 0.0–3.0)
Eosinophils Absolute: 0.1 K/uL (ref 0.0–0.7)
Eosinophils Relative: 1.7 % (ref 0.0–5.0)
HCT: 38.2 % (ref 36.0–46.0)
Hemoglobin: 13.1 g/dL (ref 12.0–15.0)
Lymphocytes Relative: 30.1 % (ref 12.0–46.0)
Lymphs Abs: 1.5 K/uL (ref 0.7–4.0)
MCHC: 34.3 g/dL (ref 30.0–36.0)
MCV: 87.6 fl (ref 78.0–100.0)
Monocytes Absolute: 0.5 K/uL (ref 0.1–1.0)
Monocytes Relative: 9.4 % (ref 3.0–12.0)
Neutro Abs: 2.9 K/uL (ref 1.4–7.7)
Neutrophils Relative %: 57.5 % (ref 43.0–77.0)
Platelets: 211 K/uL (ref 150.0–400.0)
RBC: 4.36 Mil/uL (ref 3.87–5.11)
RDW: 13.5 % (ref 11.5–15.5)
WBC: 5 K/uL (ref 4.0–10.5)

## 2022-07-03 LAB — BASIC METABOLIC PANEL
BUN: 15 mg/dL (ref 6–23)
CO2: 31 mEq/L (ref 19–32)
Calcium: 9.6 mg/dL (ref 8.4–10.5)
Chloride: 103 mEq/L (ref 96–112)
Creatinine, Ser: 0.74 mg/dL (ref 0.40–1.20)
GFR: 87.37 mL/min (ref >60.00)
Glucose, Bld: 87 mg/dL (ref 70–99)
Potassium: 4.4 mEq/L (ref 3.5–5.1)
Sodium: 142 mEq/L (ref 135–145)

## 2022-07-03 LAB — HEMOGLOBIN A1C: Hgb A1c MFr Bld: 5.8 % (ref 4.6–6.5)

## 2022-07-03 LAB — HEPATIC FUNCTION PANEL
ALT: 21 U/L (ref 0–35)
AST: 19 U/L (ref 0–37)
Albumin: 4.3 g/dL (ref 3.5–5.2)
Alkaline Phosphatase: 69 U/L (ref 39–117)
Bilirubin, Direct: 0.1 mg/dL (ref 0.0–0.3)
Total Bilirubin: 0.4 mg/dL (ref 0.2–1.2)
Total Protein: 6.9 g/dL (ref 6.0–8.3)

## 2022-07-03 LAB — LIPID PANEL
Cholesterol: 202 mg/dL — ABNORMAL HIGH (ref 0–200)
HDL: 60.3 mg/dL (ref >39.00)
LDL Cholesterol: 118 mg/dL — ABNORMAL HIGH (ref 0–99)
NonHDL: 141.32
Total CHOL/HDL Ratio: 3
Triglycerides: 116 mg/dL (ref 0.0–149.0)
VLDL: 23.2 mg/dL (ref 0.0–40.0)

## 2022-07-03 LAB — TSH: TSH: 2.97 u[IU]/mL (ref 0.35–5.50)

## 2022-07-03 MED ORDER — OLMESARTAN MEDOXOMIL 20 MG PO TABS: 20.0000 mg | ORAL_TABLET | Freq: Every day | ORAL | 0 refills | Status: DC

## 2022-07-03 MED ORDER — CARIPRAZINE HCL 1.5 MG PO CAPS: 1.5000 mg | ORAL_CAPSULE | Freq: Every day | ORAL | 1 refills | Status: DC

## 2022-07-03 NOTE — Telephone Encounter (Signed)
Key: Lisa Lynn

## 2022-07-03 NOTE — Progress Notes (Unsigned)
Subjective:  Patient ID: Lisa Lynn, female    DOB: 16-Nov-1961  Age: 61 y.o. MRN: [id number]  CC: Annual Exam, Hypertension, and Depression   HPI Lisa Lynn is a 61 yo female presents to establish care as a new patient. She has concerns today of increased weight gain and fatigue. She is currently taking levothyroxine 0.05 mg daily for Hashimoto thyroiditis. Patient reports her Gynecologist checked her thyroid function this year and it was 1.2. She would like it checked again today. She currently walks 3 miles 3-4 times a week, uses the [employer] bike once a week, strength training with a trainer and plays golf with no CP, SOB, or leg swelling. She sees her Cardiologist for her MVP and bicuspid aortic valve. Patient is also taking bupropion XL 150 mg for depression and feels like she needs an increase in her dose. She reports anhedonia, apathy and sadness and has tried several SSRI's that caused her to feel sedated and groggy. She denies suicidal ideations or self harm. She is a non smoker, but states that she drinks socially 3/ week.   History Lisa Lynn has a past medical history of Family history of breast cancer, Heart murmur, and Thyroid disease.   She has a past surgical history that includes Spine surgery and Back surgery (2010).   Her family history includes Breast cancer in an other family member; Breast cancer (age of onset: 66) in her maternal aunt; Breast cancer (age of onset: 10) in her mother; Congestive Heart Failure in her father; Depression in her brother and mother; Heart attack in her paternal grandfather; Hypertension in her mother; Stroke in her maternal grandmother and paternal grandmother; Testicular cancer (age of onset: 14) in her paternal uncle; Valvular heart disease in her father.She reports that she has quit smoking. She has never used smokeless tobacco. She reports current alcohol use. She reports that she does not use drugs.  Outpatient Medications Prior  to Visit  Medication Sig Dispense Refill   UBRELVY 100 MG TABS Take by mouth.     estradiol (VIVELLE-DOT) 0.075 MG/24HR Place onto the skin.     metoprolol tartrate (LOPRESSOR) 25 MG tablet Take 1 tablet (25 mg total) by mouth once for 1 dose. Take 2 hr prior to CT scan 1 tablet 0   progesterone (PROMETRIUM) 100 MG capsule Take by mouth.     thyroid (ARMOUR) 60 MG tablet Take 60 mg by mouth daily before breakfast.     zolpidem (AMBIEN) 5 MG tablet Take 5 mg by mouth at bedtime as needed for sleep.     No facility-administered medications prior to visit.    ROS Review of Systems  Constitutional:  Positive for fatigue and unexpected weight change (gain). Negative for diaphoresis.  HENT: Negative.    Respiratory:  Negative for cough, chest tightness and shortness of breath.   Cardiovascular:  Negative for chest pain.  Gastrointestinal:  Negative for abdominal pain, constipation, diarrhea and nausea.  Endocrine: Negative.   Musculoskeletal: Negative.   Skin: Negative.   Allergic/Immunologic: Negative.   Neurological: Negative.   Hematological:  Negative for adenopathy. Does not bruise/bleed easily.  Psychiatric/Behavioral:  Positive for dysphoric mood. Negative for confusion, decreased concentration, self-injury, sleep disturbance and suicidal ideas. The patient is nervous/anxious (depression). The patient is not hyperactive.        +feelings of worthlessness, helplessness, and hopelessness.     Objective:  BP (!) 160/78 (BP Location: Right Arm, Patient Position: Sitting, Cuff Size: Normal)   Pulse  82   Temp 98.2 F (36.8 C) (Oral)   Resp 18   Ht 5\' 5"  (1.651 m)   Wt 167 lb (75.8 kg)   BMI 27.79 kg/m   Physical Exam Vitals reviewed.  HENT:     Mouth/Throat:     Mouth: Mucous membranes are moist.  Eyes:     General: No scleral icterus.    Conjunctiva/sclera: Conjunctivae normal.  Cardiovascular:     Rate and Rhythm: Normal rate and regular rhythm.     Heart sounds: No  murmur heard. Pulmonary:     Effort: Pulmonary effort is normal.     Breath sounds: No stridor. No wheezing, rhonchi or rales.  Abdominal:     General: Abdomen is flat.     Palpations: There is no mass.     Tenderness: There is no abdominal tenderness. There is no guarding.     Hernia: No hernia is present.  Musculoskeletal:        General: Normal range of motion.     Cervical back: Neck supple.     Right lower leg: No edema.     Left lower leg: No edema.  Lymphadenopathy:     Cervical: No cervical adenopathy.  Skin:    General: Skin is warm and dry.  Neurological:     General: No focal deficit present.     Mental Status: She is alert. Mental status is at baseline.  Psychiatric:        Attention and Perception: Attention and perception normal.        Mood and Affect: Mood is depressed. Mood is not anxious. Affect is flat. Affect is not tearful.        Speech: Speech normal. Speech is not delayed or tangential.        Behavior: Behavior normal. Behavior is cooperative.        Thought Content: Thought content normal. Thought content is not paranoid or delusional. Thought content does not include homicidal or suicidal ideation.        Cognition and Memory: Cognition normal.     Lab Results  Component Value Date   WBC 5.0 07/03/2022   HGB 13.1 07/03/2022   HCT 38.2 07/03/2022   PLT 211.0 07/03/2022   GLUCOSE 87 07/03/2022   CHOL 202 (H) 07/03/2022   TRIG 116.0 07/03/2022   HDL 60.30 07/03/2022   LDLCALC 118 (H) 07/03/2022   ALT 21 07/03/2022   AST 19 07/03/2022   NA 142 07/03/2022   K 4.4 07/03/2022   CL 103 07/03/2022   CREATININE 0.74 07/03/2022   BUN 15 07/03/2022   CO2 31 07/03/2022   TSH 2.97 07/03/2022   HGBA1C 5.8 07/03/2022     Assessment & Plan:   Lisa Lynn was seen today for annual exam, hypertension and depression.  Diagnoses and all orders for this visit:  Severe episode of recurrent major depressive disorder, without psychotic features (HCC)- Will  add an atypical antipsychotic. -     TSH; Future -     Hepatic function panel; Future -     cariprazine (VRAYLAR) 1.5 MG capsule; Take 1 capsule (1.5 mg total) by mouth daily. -     Hepatic function panel -     TSH  Primary hypertension- Will check labs to evaluate for secondary causes endorgan damage.  Will treat with an ARB.   -     Aldosterone + renin activity w/ ratio; Future -     TSH; Future -  Urinalysis, Routine w reflex microscopic; Future -     Hepatic function panel; Future -     CBC with Differential/Platelet; Future -     Basic metabolic panel; Future -     Basic metabolic panel -     CBC with Differential/Platelet -     Hepatic function panel -     Urinalysis, Routine w reflex microscopic -     TSH -     Aldosterone + renin activity w/ ratio -     olmesartan (BENICAR) 20 MG tablet; Take 1 tablet (20 mg total) by mouth daily.  Encounter for general adult medical examination with abnormal findings- Exam completed, labs reviewed-statin therapy is not indicated, vaccines are up-to-date, cancer screenings addressed, patient education was given.   -     Lipid panel; Future -     Hepatitis C antibody; Future -     HIV Antibody (routine testing w rflx); Future -     HIV Antibody (routine testing w rflx) -     Hepatitis C antibody -     Lipid panel  Acquired hypothyroidism- She is euthyroid. -     TSH; Future -     TSH  Chronic hyperglycemia- A1C is normal. -     Hemoglobin A1c; Future -     Hemoglobin A1c  Need for hepatitis C screening test -     Hepatitis C antibody; Future -     Hepatitis C antibody   I have discontinued Lisa Lynn. Lisa "Carrie"'s thyroid, estradiol, progesterone, zolpidem, and metoprolol tartrate. I am also having her start on cariprazine and olmesartan. Additionally, I am having her maintain her Bernita Raisin.  Meds ordered this encounter  Medications   cariprazine (VRAYLAR) 1.5 MG capsule    Sig: Take 1 capsule (1.5 mg total) by mouth  daily.    Dispense:  90 capsule    Refill:  1   olmesartan (BENICAR) 20 MG tablet    Sig: Take 1 tablet (20 mg total) by mouth daily.    Dispense:  90 tablet    Refill:  0     Follow-up: Return in about 3 months (around 10/03/2022).  Sanda Linger, MD

## 2022-07-03 NOTE — Patient Instructions (Signed)
Hypertension, Adult High blood pressure (hypertension) is when the force of blood pumping through the arteries is too strong. The arteries are the blood vessels that carry blood from the heart throughout the body. Hypertension forces the heart to work harder to pump blood and may cause arteries to become narrow or stiff. Untreated or uncontrolled hypertension can lead to a heart attack, heart failure, a stroke, kidney disease, and other problems. A blood pressure reading consists of a higher number over a lower number. Ideally, your blood pressure should be below 120/80. The first ("top") number is called the systolic pressure. It is a measure of the pressure in your arteries as your heart beats. The second ("bottom") number is called the diastolic pressure. It is a measure of the pressure in your arteries as the heart relaxes. What are the causes? The exact cause of this condition is not known. There are some conditions that result in high blood pressure. What increases the risk? Certain factors may make you more likely to develop high blood pressure. Some of these risk factors are under your control, including: Smoking. Not getting enough exercise or physical activity. Being overweight. Having too much fat, sugar, calories, or salt (sodium) in your diet. Drinking too much alcohol. Other risk factors include: Having a personal history of heart disease, diabetes, high cholesterol, or kidney disease. Stress. Having a family history of high blood pressure and high cholesterol. Having obstructive sleep apnea. Age. The risk increases with age. What are the signs or symptoms? High blood pressure may not cause symptoms. Very high blood pressure (hypertensive crisis) may cause: Headache. Fast or irregular heartbeats (palpitations). Shortness of breath. Nosebleed. Nausea and vomiting. Vision changes. Severe chest pain, dizziness, and seizures. How is this diagnosed? This condition is diagnosed by  measuring your blood pressure while you are seated, with your arm resting on a flat surface, your legs uncrossed, and your feet flat on the floor. The cuff of the blood pressure monitor will be placed directly against the skin of your upper arm at the level of your heart. Blood pressure should be measured at least twice using the same arm. Certain conditions can cause a difference in blood pressure between your right and left arms. If you have a high blood pressure reading during one visit or you have normal blood pressure with other risk factors, you may be asked to: Return on a different day to have your blood pressure checked again. Monitor your blood pressure at home for 1 week or longer. If you are diagnosed with hypertension, you may have other blood or imaging tests to help your health care provider understand your overall risk for other conditions. How is this treated? This condition is treated by making healthy lifestyle changes, such as eating healthy foods, exercising more, and reducing your alcohol intake. You may be referred for counseling on a healthy diet and physical activity. Your health care provider may prescribe medicine if lifestyle changes are not enough to get your blood pressure under control and if: Your systolic blood pressure is above 130. Your diastolic blood pressure is above 80. Your personal target blood pressure may vary depending on your medical conditions, your age, and other factors. Follow these instructions at home: Eating and drinking  Eat a diet that is high in fiber and potassium, and low in sodium, added sugar, and fat. An example of this eating plan is called the DASH diet. DASH stands for Dietary Approaches to Stop Hypertension. To eat this way: Eat   plenty of fresh fruits and vegetables. Try to fill one half of your plate at each meal with fruits and vegetables. Eat whole grains, such as whole-wheat pasta, brown rice, or whole-grain bread. Fill about one  fourth of your plate with whole grains. Eat or drink low-fat dairy products, such as skim milk or low-fat yogurt. Avoid fatty cuts of meat, processed or cured meats, and poultry with skin. Fill about one fourth of your plate with lean proteins, such as fish, chicken without skin, beans, eggs, or tofu. Avoid pre-made and processed foods. These tend to be higher in sodium, added sugar, and fat. Reduce your daily sodium intake. Many people with hypertension should eat less than 1,500 mg of sodium a day. Do not drink alcohol if: Your health care provider tells you not to drink. You are pregnant, may be pregnant, or are planning to become pregnant. If you drink alcohol: Limit how much you have to: 0-1 drink a day for women. 0-2 drinks a day for men. Know how much alcohol is in your drink. In the [place] one drink equals one 12 oz bottle of beer (355 mL), one 5 oz glass of wine (148 mL), or one 1 oz glass of hard liquor (44 mL). Lifestyle  Work with your health care provider to maintain a healthy body weight or to lose weight. Ask what an ideal weight is for you. Get at least 30 minutes of exercise that causes your heart to beat faster (aerobic exercise) most days of the week. Activities may include walking, swimming, or biking. Include exercise to strengthen your muscles (resistance exercise), such as Pilates or lifting weights, as part of your weekly exercise routine. Try to do these types of exercises for 30 minutes at least 3 days a week. Do not use any products that contain nicotine or tobacco. These products include cigarettes, chewing tobacco, and vaping devices, such as e-cigarettes. If you need help quitting, ask your health care provider. Monitor your blood pressure at home as told by your health care provider. Keep all follow-up visits. This is important. Medicines Take over-the-counter and prescription medicines only as told by your health care provider. Follow directions carefully. Blood  pressure medicines must be taken as prescribed. Do not skip doses of blood pressure medicine. Doing this puts you at risk for problems and can make the medicine less effective. Ask your health care provider about side effects or reactions to medicines that you should watch for. Contact a health care provider if you: Think you are having a reaction to a medicine you are taking. Have headaches that keep coming back (recurring). Feel dizzy. Have swelling in your ankles. Have trouble with your vision. Get help right away if you: Develop a severe headache or confusion. Have unusual weakness or numbness. Feel faint. Have severe pain in your chest or abdomen. Vomit repeatedly. Have trouble breathing. These symptoms may be an emergency. Get help right away. Call 911. Do not wait to see if the symptoms will go away. Do not drive yourself to the hospital. Summary Hypertension is when the force of blood pumping through your arteries is too strong. If this condition is not controlled, it may put you at risk for serious complications. Your personal target blood pressure may vary depending on your medical conditions, your age, and other factors. For most people, a normal blood pressure is less than 120/80. Hypertension is treated with lifestyle changes, medicines, or a combination of both. Lifestyle changes include losing weight, eating a healthy,   low-sodium diet, exercising more, and limiting alcohol. This information is not intended to replace advice given to you by your health care provider. Make sure you discuss any questions you have with your health care provider. Document Revised: 09/11/2021 Document Reviewed: 09/11/2021 Elsevier Patient Education  2023 Elsevier Inc.  

## 2022-07-04 DIAGNOSIS — K635 Polyp of colon: Secondary | ICD-10-CM | POA: Insufficient documentation

## 2022-07-06 ENCOUNTER — Encounter: Payer: Self-pay | Admitting: Internal Medicine

## 2022-07-06 ENCOUNTER — Other Ambulatory Visit: Payer: Self-pay | Admitting: Internal Medicine

## 2022-07-08 ENCOUNTER — Other Ambulatory Visit: Payer: Self-pay | Admitting: Internal Medicine

## 2022-07-08 DIAGNOSIS — G43009 Migraine without aura, not intractable, without status migrainosus: Secondary | ICD-10-CM

## 2022-07-08 DIAGNOSIS — E039 Hypothyroidism, unspecified: Secondary | ICD-10-CM

## 2022-07-08 MED ORDER — LEVOTHYROXINE SODIUM 50 MCG PO TABS: 50.0000 ug | ORAL_TABLET | Freq: Every day | ORAL | 0 refills | Status: AC

## 2022-07-08 MED ORDER — UBRELVY 100 MG PO TABS: 1.0000 | ORAL_TABLET | Freq: Every day | ORAL | 2 refills | Status: DC | PRN

## 2022-07-09 LAB — ALDOSTERONE + RENIN ACTIVITY W/ RATIO
ALDO / PRA Ratio: 8.3 Ratio (ref 0.9–28.9)
Aldosterone: 3 ng/dL
Renin Activity: 0.36 ng/mL/h (ref 0.25–5.82)

## 2022-07-09 LAB — HIV ANTIBODY (ROUTINE TESTING W REFLEX): HIV 1&2 Ab, 4th Generation: NONREACTIVE

## 2022-07-09 LAB — HEPATITIS C ANTIBODY: Hepatitis C Ab: NONREACTIVE

## 2022-07-10 NOTE — Telephone Encounter (Signed)
Per CoverMyMeds: ? ?PA was denied.  ?

## 2022-07-11 ENCOUNTER — Other Ambulatory Visit: Payer: Self-pay | Admitting: Internal Medicine

## 2022-07-11 DIAGNOSIS — F332 Major depressive disorder, recurrent severe without psychotic features: Secondary | ICD-10-CM

## 2022-07-11 MED ORDER — ARIPIPRAZOLE 2 MG PO TABS: 2.0000 mg | ORAL_TABLET | Freq: Every day | ORAL | 0 refills | Status: DC

## 2022-07-16 DIAGNOSIS — M5416 Radiculopathy, lumbar region: Secondary | ICD-10-CM | POA: Diagnosis not present

## 2022-07-17 ENCOUNTER — Encounter: Payer: Self-pay | Admitting: Internal Medicine

## 2022-07-17 ENCOUNTER — Ambulatory Visit: Payer: BC Managed Care – PPO | Attending: Internal Medicine | Admitting: Internal Medicine

## 2022-07-17 VITALS — BP 130/76 | HR 74 | Ht 65.0 in | Wt 161.0 lb

## 2022-07-17 DIAGNOSIS — I351 Nonrheumatic aortic (valve) insufficiency: Secondary | ICD-10-CM | POA: Diagnosis not present

## 2022-07-17 DIAGNOSIS — Q231 Congenital insufficiency of aortic valve: Secondary | ICD-10-CM

## 2022-07-17 NOTE — Patient Instructions (Signed)
Medication Instructions:  No Changes In Medications at this time.  *If you need a refill on your cardiac medications before your next appointment, please call your pharmacy*  Lab Work: None Ordered At This Time.  If you have labs (blood work) drawn today and your tests are completely normal, you will receive your results only by: MyChart Message (if you have MyChart) OR A paper copy in the mail If you have any lab test that is abnormal or we need to change your treatment, we will call you to review the results.  Testing/Procedures: Your physician has requested that you have an echocardiogram. Echocardiography is a painless test that uses sound waves to create images of your heart. It provides your doctor with information about the size and shape of your heart and how well your heart's chambers and valves are working. You may receive an ultrasound enhancing agent through an IV if needed to better visualize your heart during the echo.This procedure takes approximately one hour. There are no restrictions for this procedure. This will take place at the [address], [address].   Follow-Up: At [facility], you and your health needs are our priority.  As part of our continuing mission to provide you with exceptional heart care, we have created designated Provider Care Teams.  These Care Teams include your primary Cardiologist (physician) and Advanced Practice Providers (APPs -  Physician Assistants and Nurse Practitioners) who all work together to provide you with the care you need, when you need it.  Your next appointment:   1 year(s)  The format for your next appointment:   In Person  Provider:   Gayatri A Acharya, MD           

## 2022-07-17 NOTE — Progress Notes (Signed)
Cardiology Office Note:    Date:  07/17/2022   ID:  Nikolette Byers Billard, DOB 22-Mar-1961, MRN [id number]  PCP:  Janith Lima, MD  Cardiologist:  Elouise Munroe, MD  Electrophysiologist:  None   Referring MD: Fanny Bien, MD   Chief Complaint/Reason for Referral: BAV  History of Present Illness:    Lisa Lynn is a 61 y.o. female with a history of bicuspid aortic valve and hypothyroidism.  Doing well overall. Reviewed last echo, as well as CCTA obtained for chest pain. 0 cor cal, normal coronaries. Mid LAD bridging and bicuspid AV noted.   Reviewed need for repeat echo to follow AI.   The patient denies chest pain, chest pressure, dyspnea at rest or with exertion, palpitations, PND, orthopnea, or leg swelling. Denies cough, fever, chills. Denies nausea, vomiting. Denies syncope or presyncope. Denies dizziness or lightheadedness. Denies snoring.   Past Medical History:  Diagnosis Date   Family history of breast cancer    Heart murmur    Thyroid disease    hypothyroid    Past Surgical History:  Procedure Laterality Date   BACK SURGERY  2010   SPINE SURGERY      Current Medications: Current Meds  Medication Sig   buPROPion (WELLBUTRIN XL) 150 MG 24 hr tablet    levothyroxine (SYNTHROID) 50 MCG tablet Take 1 tablet (50 mcg total) by mouth daily.   olmesartan (BENICAR) 20 MG tablet Take 1 tablet (20 mg total) by mouth daily.   UBRELVY 100 MG TABS Take 1 tablet by mouth daily as needed.     Allergies:   Patient has no known allergies.   Social History   Tobacco Use   Smoking status: Former   Smokeless tobacco: Never   Tobacco comments:    teenager  Substance Use Topics   Alcohol use: Yes    Comment: 2-4 drinks a week   Drug use: No     Family History: The patient's family history includes Breast cancer in an other family member; Breast cancer (age of onset: 46) in her maternal aunt; Breast cancer (age of onset: 44) in her mother;  Congestive Heart Failure in her father; Depression in her brother and mother; Heart attack in her paternal grandfather; Hypertension in her mother; Stroke in her maternal grandmother and paternal grandmother; Testicular cancer (age of onset: 40) in her paternal uncle; Valvular heart disease in her father.  ROS:   Please see the history of present illness.    All other systems reviewed and are negative.  EKGs/Labs/Other Studies Reviewed:    The following studies were reviewed today:  EKG:  NSR  Imaging studies that I have independently reviewed today: CCTA 06/20/21  Recent Labs: 07/03/2022: ALT 21; BUN 15; Creatinine, Ser 0.74; Hemoglobin 13.1; Platelets 211.0; Potassium 4.4; Sodium 142; TSH 2.97  Recent Lipid Panel    Component Value Date/Time   CHOL 202 (H) 07/03/2022 0921   TRIG 116.0 07/03/2022 0921   HDL 60.30 07/03/2022 0921   CHOLHDL 3 07/03/2022 0921   VLDL 23.2 07/03/2022 0921   LDLCALC 118 (H) 07/03/2022 0921    Physical Exam:    VS:  BP 130/76   Pulse 74   Ht 5\' 5"  (1.651 m)   Wt 161 lb (73 kg)   SpO2 96%   BMI 26.79 kg/m     Wt Readings from Last 5 Encounters:  07/17/22 161 lb (73 kg)  07/03/22 167 lb (75.8 kg)  06/05/21 155 lb (70.3 kg)  10/27/20 155 lb (70.3 kg)  09/27/20 161 lb (73 kg)    Constitutional: No acute distress Eyes: sclera non-icteric, normal conjunctiva and lids ENMT: normal dentition, moist mucous membranes Cardiovascular: regular rhythm, normal rate, no murmur. S1 and S2 normal. No jugular venous distention.  Respiratory: clear to auscultation bilaterally GI : normal bowel sounds, soft and nontender. No distention.   MSK: extremities warm, well perfused. No edema.  NEURO: grossly nonfocal exam, moves all extremities. PSYCH: alert and oriented x 3, normal mood and affect.   ASSESSMENT:    1. Bicuspid aortic valve   2. Nonrheumatic aortic valve insufficiency    PLAN:    Bicuspid aortic valve - Plan: EKG 12-Lead, ECHOCARDIOGRAM  COMPLETE  Nonrheumatic aortic valve insufficiency - Plan: EKG 12-Lead, ECHOCARDIOGRAM COMPLETE  - will repeat echo to follow mild-mod AI. No AS on last echo. No coronary artery disease. No coarctation from echo Doppler. She notes slight change in BP, upward, we will track and no med changes at this time. Recently started on olmesartan, continue at current dose.  Total time of encounter: 30 minutes total time of encounter, including 20 minutes spent in face-to-face patient care on the date of this encounter. This time includes coordination of care and counseling regarding above mentioned problem list. Remainder of non-face-to-face time involved reviewing chart documents/testing relevant to the patient encounter and documentation in the medical record. I have independently reviewed documentation from referring provider.   Weston Brass, MD, [facility]  [facility]   Shared Decision Making/Informed Consent:       Medication Adjustments/Labs and Tests Ordered: Current medicines are reviewed at length with the patient today.  Concerns regarding medicines are outlined above.   Orders Placed This Encounter  Procedures   EKG 12-Lead   ECHOCARDIOGRAM COMPLETE    No orders of the defined types were placed in this encounter.   Patient Instructions  Medication Instructions:  No Changes In Medications at this time.  *If you need a refill on your cardiac medications before your next appointment, please call your pharmacy*  Lab Work: None Ordered At This Time.  If you have labs (blood work) drawn today and your tests are completely normal, you will receive your results only by: MyChart Message (if you have MyChart) OR A paper copy in the mail If you have any lab test that is abnormal or we need to change your treatment, we will call you to review the results.  Testing/Procedures: Your physician has requested that you have an echocardiogram. Echocardiography is a painless test  that uses sound waves to create images of your heart. It provides your doctor with information about the size and shape of your heart and how well your heart's chambers and valves are working. You may receive an ultrasound enhancing agent through an IV if needed to better visualize your heart during the echo.This procedure takes approximately one hour. There are no restrictions for this procedure. This will take place at the [address].   Follow-Up: At [facility], you and your health needs are our priority.  As part of our continuing mission to provide you with exceptional heart care, we have created designated Provider Care Teams.  These Care Teams include your primary Cardiologist (physician) and Advanced Practice Providers (APPs -  Physician Assistants and Nurse Practitioners) who all work together to provide you with the care you need, when you need it.  Your next appointment:   1 year(s)  The  format for your next appointment:   In Person  Provider:   Parke Poisson, MD

## 2022-07-31 ENCOUNTER — Ambulatory Visit: Payer: BC Managed Care – PPO | Admitting: Internal Medicine

## 2022-08-01 ENCOUNTER — Ambulatory Visit (HOSPITAL_COMMUNITY): Payer: BC Managed Care – PPO | Attending: Cardiology

## 2022-08-01 DIAGNOSIS — I351 Nonrheumatic aortic (valve) insufficiency: Secondary | ICD-10-CM | POA: Insufficient documentation

## 2022-08-01 DIAGNOSIS — Q231 Congenital insufficiency of aortic valve: Secondary | ICD-10-CM | POA: Insufficient documentation

## 2022-08-01 LAB — ECHOCARDIOGRAM COMPLETE
AR max vel: 1.59 cm2
AV Area VTI: 1.55 cm2
AV Area mean vel: 1.57 cm2
AV Mean grad: 7 mmHg
AV Peak grad: 12.8 mmHg
AV Vena cont: 0.3 cm
Ao pk vel: 1.79 m/s
Area-P 1/2: 3.42 cm2
P 1/2 time: 411 msec
S' Lateral: 3.5 cm

## 2022-08-02 ENCOUNTER — Other Ambulatory Visit: Payer: Self-pay

## 2022-08-02 DIAGNOSIS — I351 Nonrheumatic aortic (valve) insufficiency: Secondary | ICD-10-CM

## 2022-08-02 DIAGNOSIS — Q231 Congenital insufficiency of aortic valve: Secondary | ICD-10-CM

## 2022-08-08 ENCOUNTER — Encounter: Payer: Self-pay | Admitting: Gastroenterology

## 2022-09-17 ENCOUNTER — Encounter: Payer: Self-pay | Admitting: Gastroenterology

## 2022-09-17 ENCOUNTER — Ambulatory Visit (INDEPENDENT_AMBULATORY_CARE_PROVIDER_SITE_OTHER): Payer: BC Managed Care – PPO | Admitting: Gastroenterology

## 2022-09-17 VITALS — BP 110/70 | HR 58 | Ht 65.0 in | Wt 164.5 lb

## 2022-09-17 DIAGNOSIS — K5730 Diverticulosis of large intestine without perforation or abscess without bleeding: Secondary | ICD-10-CM | POA: Diagnosis not present

## 2022-09-17 DIAGNOSIS — Z86010 Personal history of colonic polyps: Secondary | ICD-10-CM

## 2022-09-17 MED ORDER — NA SULFATE-K SULFATE-MG SULF 17.5-3.13-1.6 GM/177ML PO SOLN: 1.0000 | Freq: Once | ORAL | 0 refills | Status: AC

## 2022-09-17 NOTE — Progress Notes (Addendum)
Chief Complaint: History of colon polyps, discuss colonoscopy for ongoing surveillance  Referring Provider:     Janith Lima, MD   HPI:     Lisa Lynn is a 61 y.o. female with a history of HTN, hypothyroidism, depression, diverticulosis, colon polyps, referred to the [facility] for evaluation of colonoscopy for ongoing polyp surveillance.  Last colonoscopy was approximately 6 years ago (by Dr. Earlean Shawl?) and notable for colon polyps per patient.  Unsure about size, number, location, histology, but was told to repeat in 5 years for ongoing surveillance.  No report or pathology available in EMR for review.  She is otherwise without any GI symptoms.  Father with part of colon removed, but unsure of indication and whether or not he had cancer.  Did not have chemotherapy or radiation.   Past Medical History:  Diagnosis Date   Colon polyps    Depression    Diverticulitis    Family history of breast cancer    Heart murmur    Hypertension    Thyroid disease    hypothyroid     Past Surgical History:  Procedure Laterality Date   BACK SURGERY  11/18/2008   SPINE SURGERY     URETHRAL SLING     Family History  Problem Relation Age of Onset   Hypertension Mother    Breast cancer Mother 32   Depression Mother    Congestive Heart Failure Father    Valvular heart disease Father    Depression Brother    Breast cancer Maternal Aunt 52   Testicular cancer Paternal Uncle 16   Stroke Maternal Grandmother    Stroke Paternal Grandmother    Heart attack Paternal Grandfather    Breast cancer Other        MGMs sister dx in early 63s   Social History   Tobacco Use   Smoking status: Former   Smokeless tobacco: Never   Tobacco comments:    teenager  Substance Use Topics   Alcohol use: Yes    Comment: 2-4 drinks a week   Drug use: No   Current Outpatient Medications  Medication Sig Dispense Refill   levothyroxine (SYNTHROID) 50 MCG tablet  Take 1 tablet (50 mcg total) by mouth daily. 90 tablet 0   olmesartan (BENICAR) 20 MG tablet Take 1 tablet (20 mg total) by mouth daily. 90 tablet 0   UBRELVY 100 MG TABS Take 1 tablet by mouth daily as needed. 30 tablet 2   zolpidem (AMBIEN) 5 MG tablet Take 5-10 mg by mouth at bedtime as needed.     buPROPion (WELLBUTRIN XL) 150 MG 24 hr tablet      No current facility-administered medications for this visit.   No Known Allergies   Review of Systems: All systems reviewed and negative except where noted in HPI.     Physical Exam:    Wt Readings from Last 3 Encounters:  09/17/22 164 lb 8 oz (74.6 kg)  07/17/22 161 lb (73 kg)  07/03/22 167 lb (75.8 kg)    BP 110/70   Pulse (!) 58   Ht '5\' 5"'$  (1.651 m)   Wt 164 lb 8 oz (74.6 kg)   BMI 27.37 kg/m  Constitutional:  Pleasant, in no acute distress. Psychiatric: Normal mood and affect. Behavior is normal. Cardiovascular: Normal rate, regular rhythm. No edema Pulmonary/chest: Effort normal and breath sounds normal. No wheezing, rales or rhonchi. Abdominal: Soft, nondistended,  nontender. Neurological: Alert and oriented to person place and time. Skin: Skin is warm and dry. No rashes noted.   ASSESSMENT AND PLAN;   1) History of colon polyps Due for repeat colonoscopy for ongoing polyp surveillance. - Schedule colonoscopy  2) Diverticulosis Discussed pathophysiology of diverticulosis along with natural course of disease.  No history of diverticulitis with diverticular bleed - Evaluate location and severity of diverticular disease at time of colonoscopy as above  The indications, risks, and benefits of colonoscopy were explained to the patient in detail. Risks include but are not limited to bleeding, perforation, adverse reaction to medications, and cardiopulmonary compromise. Sequelae include but are not limited to the possibility of surgery, hospitalization, and mortality. The patient verbalized understanding and wished to  proceed. All questions answered, referred to the scheduler and bowel prep ordered. Further recommendations pending results of the exam.     [name], DO, FACG  09/17/2022, 11:35 AM   Janith Lima, MD  Addendum: Received message from patient about correction of updated medication list as follows:   betamethasone dipropionate   Calm Magnesium powder   Levothyroxine 50 mcg   Ubrelvy 100 mg   Zepbound 5.0 pen   Zolpidem 5 mg

## 2022-09-17 NOTE — Patient Instructions (Signed)
You have been scheduled for a colonoscopy. Please follow written instructions given to you at your visit today.  Please pick up your prep supplies at the pharmacy within the next 1-3 days. If you use inhalers (even only as needed), please bring them with you on the day of your procedure.  We have sent the following medications to your pharmacy for you to pick up at your convenience: Suprep  Due to recent changes in healthcare laws, you may see the results of your imaging and laboratory studies on MyChart before your provider has had a chance to review them.  We understand that in some cases there may be results that are confusing or concerning to you. Not all laboratory results come back in the same time frame and the provider may be waiting for multiple results in order to interpret others.  Please give [place] 48 hours in order for your provider to thoroughly review all the results before contacting the office for clarification of your results.     Thank you for choosing me and [facility].  Vito Cirigliano, D.O.

## 2022-09-30 ENCOUNTER — Encounter: Payer: BC Managed Care – PPO | Admitting: Gastroenterology

## 2022-10-07 ENCOUNTER — Other Ambulatory Visit: Payer: Self-pay | Admitting: Internal Medicine

## 2022-10-07 DIAGNOSIS — I10 Essential (primary) hypertension: Secondary | ICD-10-CM

## 2022-10-15 DIAGNOSIS — Z124 Encounter for screening for malignant neoplasm of cervix: Secondary | ICD-10-CM | POA: Diagnosis not present

## 2022-10-15 DIAGNOSIS — Z1231 Encounter for screening mammogram for malignant neoplasm of breast: Secondary | ICD-10-CM | POA: Diagnosis not present

## 2022-10-15 DIAGNOSIS — Z1389 Encounter for screening for other disorder: Secondary | ICD-10-CM | POA: Diagnosis not present

## 2022-10-15 DIAGNOSIS — Z1151 Encounter for screening for human papillomavirus (HPV): Secondary | ICD-10-CM | POA: Diagnosis not present

## 2022-10-15 DIAGNOSIS — Z01419 Encounter for gynecological examination (general) (routine) without abnormal findings: Secondary | ICD-10-CM | POA: Diagnosis not present

## 2023-01-06 ENCOUNTER — Encounter: Payer: Self-pay | Admitting: Gastroenterology

## 2023-01-21 ENCOUNTER — Ambulatory Visit (AMBULATORY_SURGERY_CENTER): Payer: BC Managed Care – PPO | Admitting: *Deleted

## 2023-01-21 ENCOUNTER — Encounter: Payer: Self-pay | Admitting: Gastroenterology

## 2023-01-21 VITALS — Ht 65.0 in | Wt 153.0 lb

## 2023-01-21 DIAGNOSIS — Z86010 Personal history of colonic polyps: Secondary | ICD-10-CM

## 2023-01-21 MED ORDER — NA SULFATE-K SULFATE-MG SULF 17.5-3.13-1.6 GM/177ML PO SOLN: 1.0000 | Freq: Once | ORAL | 0 refills | Status: AC

## 2023-01-21 NOTE — Progress Notes (Signed)
Patient's chart reviewed by Osvaldo Angst CNRA prior to previsit and patient appropriate for the [facility].   No egg or soy allergy known to patient  No issues known to pt with past sedation with any surgeries or procedures Patient denies ever being told they had issues or difficulty with intubation  No issues moving head or neck No issues with swallowing No FH of Malignant Hyperthermia Pt denies any upcoming cardiac testing  last fall 2023 Pt is not on diet pills Pt is not on  home 02  Pt is not on blood thinners  Pt is not on dialysis Pt denies issues with constipation  Pt states weight is 153 lb Pt encouraged to use to use Singlecare or Goodrx to reduce cost Instructions reviewed with pt and pt states understanding. Instructed to review again prior to procedure. Pt states they will.  Instructions sent by mail with verified by pt address and with coupon if applicable.  Instructions also sent by my chart  Previsit completed and red dot placed by patient's name on their procedure day (on provider's schedule).    Visit by phone

## 2023-01-23 ENCOUNTER — Other Ambulatory Visit: Payer: Self-pay | Admitting: Internal Medicine

## 2023-01-23 DIAGNOSIS — G43009 Migraine without aura, not intractable, without status migrainosus: Secondary | ICD-10-CM

## 2023-01-27 ENCOUNTER — Telehealth: Payer: Self-pay

## 2023-01-27 NOTE — Telephone Encounter (Signed)
Key: [id number]

## 2023-02-04 ENCOUNTER — Ambulatory Visit (AMBULATORY_SURGERY_CENTER): Payer: BC Managed Care – PPO | Admitting: Gastroenterology

## 2023-02-04 ENCOUNTER — Encounter: Payer: Self-pay | Admitting: Gastroenterology

## 2023-02-04 VITALS — BP 129/65 | HR 64 | Temp 98.4°F | Resp 10 | Ht 65.0 in | Wt 153.0 lb

## 2023-02-04 DIAGNOSIS — Z860100 Personal history of colon polyps, unspecified: Secondary | ICD-10-CM

## 2023-02-04 DIAGNOSIS — D123 Benign neoplasm of transverse colon: Secondary | ICD-10-CM | POA: Diagnosis not present

## 2023-02-04 DIAGNOSIS — Z1211 Encounter for screening for malignant neoplasm of colon: Secondary | ICD-10-CM

## 2023-02-04 DIAGNOSIS — K641 Second degree hemorrhoids: Secondary | ICD-10-CM

## 2023-02-04 DIAGNOSIS — D125 Benign neoplasm of sigmoid colon: Secondary | ICD-10-CM

## 2023-02-04 DIAGNOSIS — K5730 Diverticulosis of large intestine without perforation or abscess without bleeding: Secondary | ICD-10-CM

## 2023-02-04 DIAGNOSIS — K648 Other hemorrhoids: Secondary | ICD-10-CM

## 2023-02-04 DIAGNOSIS — Z09 Encounter for follow-up examination after completed treatment for conditions other than malignant neoplasm: Secondary | ICD-10-CM | POA: Diagnosis not present

## 2023-02-04 MED ORDER — SODIUM CHLORIDE 0.9 % IV SOLN: 500.0000 mL | Freq: Once | INTRAVENOUS | Status: DC

## 2023-02-04 NOTE — Progress Notes (Signed)
GASTROENTEROLOGY PROCEDURE H&P NOTE   Primary Care Physician: Janith Lima, MD    Reason for Procedure:  Colon Cancer screening, colon polyp surveillance  Plan:    Colonoscopy  Patient is appropriate for endoscopic procedure(s) in the ambulatory ([place]) setting.  The nature of the procedure, as well as the risks, benefits, and alternatives were carefully and thoroughly reviewed with the patient. Ample time for discussion and questions allowed. The patient understood, was satisfied, and agreed to proceed.     HPI: Lisa Lynn is a 62 y.o. female who presents for colonoscopy for ongoing polyp surveillance and Colon Cancer screening.    Last colonoscopy was approximately 6 years ago (by Dr. Earlean Shawl?) and notable for colon polyps per patient.  Unsure about size, number, location, histology, but was told to repeat in 5 years for ongoing surveillance.  No report or pathology available in EMR for review.   She is otherwise without any GI symptoms.   Father with part of colon removed, but unsure of indication and whether or not he had cancer.  Did not have chemotherapy or radiation.  Past Medical History:  Diagnosis Date   Colon polyps    Depression    Diverticulitis    Family history of breast cancer    Heart murmur    Hypertension    Thyroid disease    hypothyroid    Past Surgical History:  Procedure Laterality Date   BACK SURGERY  11/18/2008   SPINE SURGERY     URETHRAL SLING      Prior to Admission medications   Medication Sig Start Date End Date Taking? Authorizing Provider  GABA-Mag Cl-Taurine-Theanine (CALM IJ) Take by mouth at bedtime. powder   Yes [provider]  levothyroxine (SYNTHROID) 50 MCG tablet Take 1 tablet (50 mcg total) by mouth daily. 07/08/22  Yes Janith Lima, MD  zolpidem (AMBIEN) 5 MG tablet Take 5-10 mg by mouth at bedtime as needed. 09/10/22  Yes [provider]  betamethasone dipropionate 0.05 % lotion APPLY  LIBERALLY TOPICALLY TO THE AFFECTED AREA EVERY DAY    [provider]  UBRELVY 100 MG TABS TAKE 1 TABLET BY MOUTH DAILY AS NEEDED 01/23/23   Janith Lima, MD  ZEPBOUND 2.5 MG/0.5ML Pen SMARTSIG:2.5 Milligram(s) SUB-Q Once a Week 11/09/22   [provider]    Current Outpatient Medications  Medication Sig Dispense Refill   GABA-Mag Cl-Taurine-Theanine (CALM IJ) Take by mouth at bedtime. powder     levothyroxine (SYNTHROID) 50 MCG tablet Take 1 tablet (50 mcg total) by mouth daily. 90 tablet 0   zolpidem (AMBIEN) 5 MG tablet Take 5-10 mg by mouth at bedtime as needed.     betamethasone dipropionate 0.05 % lotion APPLY LIBERALLY TOPICALLY TO THE AFFECTED AREA EVERY DAY     UBRELVY 100 MG TABS TAKE 1 TABLET BY MOUTH DAILY AS NEEDED 60 tablet 1   ZEPBOUND 2.5 MG/0.5ML Pen SMARTSIG:2.5 Milligram(s) SUB-Q Once a Week     Current Facility-Administered Medications  Medication Dose Route Frequency Provider Last Rate Last Admin   0.9 %  sodium chloride infusion  500 mL Intravenous Once Tasheka Houseman V, DO        Allergies as of 02/04/2023   (No Known Allergies)    Family History  Problem Relation Age of Onset   Hypertension Mother    Breast cancer Mother 62   Depression Mother    Colon polyps Father    Congestive Heart Failure Father  Valvular heart disease Father    Depression Brother    Breast cancer Maternal Aunt 51   Testicular cancer Paternal Uncle 36   Stroke Maternal Grandmother    Stroke Paternal Grandmother    Heart attack Paternal Grandfather    Breast cancer Other        MGMs sister dx in early 59s   Colon cancer Neg Hx    Esophageal cancer Neg Hx    Rectal cancer Neg Hx    Stomach cancer Neg Hx     Social History   Socioeconomic History   Marital status: Married    Spouse name: Not on file   Number of children: Not on file   Years of education: Not on file   Highest education level: Not on file  Occupational History   Not on file   Tobacco Use   Smoking status: Former   Smokeless tobacco: Never   Tobacco comments:    teenager  Vaping Use   Vaping Use: Never used  Substance and Sexual Activity   Alcohol use: Yes    Comment: 2-4 drinks a week   Drug use: No   Sexual activity: Yes    Birth control/protection: Post-menopausal  Other Topics Concern   Not on file  Social History Narrative   Not on file   Social Determinants of Health   Financial Resource Strain: Not on file  Food Insecurity: Not on file  Transportation Needs: Not on file  Physical Activity: Not on file  Stress: Not on file  Social Connections: Not on file  Intimate Partner Violence: Not on file    Physical Exam: Vital signs in last 24 hours: @BP  (!) 145/80   Pulse 70   Temp 98.4 F (36.9 C) (Temporal)   Ht 5\' 5"  (1.651 m)   Wt 153 lb (69.4 kg)   SpO2 100%   BMI 25.46 kg/m  GEN: NAD EYE: Sclerae anicteric ENT: MMM CV: Non-tachycardic Pulm: CTA b/l GI: Soft, NT/ND NEURO:  Alert & Oriented x [place], DO [facility]   02/04/2023 2:56 PM

## 2023-02-04 NOTE — Op Note (Signed)
[name] Procedure Date: 02/04/2023 2:58 PM MRN: [id number] Endoscopist: Gerrit Heck , MD, [id number] Age: 62 Referring MD:  Date of Birth: 09-16-1961 Gender: Female Account #: [id number] Procedure:                Colonoscopy Indications:              Surveillance: Personal history of colonic polyps                            (unknown histology) on last colonoscopy more than 5                            years ago Medicines:                Monitored Anesthesia Care Procedure:                Pre-Anesthesia Assessment:                           - Prior to the procedure, a History and Physical                            was performed, and patient medications and                            allergies were reviewed. The patient's tolerance of                            previous anesthesia was also reviewed. The risks                            and benefits of the procedure and the sedation                            options and risks were discussed with the patient.                            All questions were answered, and informed consent                            was obtained. Prior Anticoagulants: The patient has                            taken no anticoagulant or antiplatelet agents. ASA                            Grade Assessment: II - A patient with mild systemic                            disease. After reviewing the risks and benefits,                            the patient was deemed in satisfactory condition to  undergo the procedure.                           After obtaining informed consent, the colonoscope                            was passed under direct vision. Throughout the                            procedure, the patient's blood pressure, pulse, and                            oxygen saturations were monitored continuously. The                            CF [id number] was introduced through  the anus                            and advanced to the the terminal ileum. The                            colonoscopy was performed without difficulty. The                            patient tolerated the procedure well. The quality                            of the bowel preparation was good. The terminal                            ileum, ileocecal valve, appendiceal orifice, and                            rectum were photographed. Scope In: 3:05:06 PM Scope Out: 3:27:52 PM Scope Withdrawal Time: 0 hours 16 minutes 22 seconds  Total Procedure Duration: 0 hours 22 minutes 46 seconds  Findings:                 The perianal and digital rectal examinations were                            normal.                           Two sessile polyps were found in the distal sigmoid                            colon and splenic flexure. The polyps were 2 to 4                            mm in size. These polyps were removed with a cold                            snare. Resection and retrieval were complete.  Estimated blood loss was minimal.                           Multiple large-mouthed and small-mouthed                            diverticula were found in the sigmoid colon and                            descending colon.                           Non-bleeding internal hemorrhoids were found during                            retroflexion. The hemorrhoids were small.                           The terminal ileum appeared normal. Complications:            No immediate complications. Estimated Blood Loss:     Estimated blood loss was minimal. Impression:               - Two 2 to 4 mm polyps in the distal sigmoid colon                            and at the splenic flexure, removed with a cold                            snare. Resected and retrieved.                           - Diverticulosis in the sigmoid colon and in the                            descending colon.                            - Non-bleeding internal hemorrhoids.                           - The examined portion of the ileum was normal.                           - The GI Genius (intelligent endoscopy module),                            computer-aided polyp detection system powered by AI                            was utilized to detect colorectal polyps through                            enhanced visualization during colonoscopy. Recommendation:           - Patient has a contact number available for  emergencies. The signs and symptoms of potential                            delayed complications were discussed with the                            patient. Return to normal activities tomorrow.                            Written discharge instructions were provided to the                            patient.                           - Resume previous diet.                           - Continue present medications.                           - Await pathology results.                           - Repeat colonoscopy for surveillance based on                            pathology results.                           - Return to GI office PRN. Gerrit Heck, MD 02/04/2023 3:37:26 PM

## 2023-02-04 NOTE — Progress Notes (Signed)
Pt's states no medical or surgical changes since previsit or office visit. 

## 2023-02-04 NOTE — Progress Notes (Signed)
Vss nad trans to pacu 

## 2023-02-04 NOTE — Patient Instructions (Signed)
Handouts provided about hemorrhoids, diverticulosis and polyps.  Resume regular diet. Continue present medications.  Await pathology results.  Repeat colonoscopy for surveillance based on pathology results.    YOU HAD AN ENDOSCOPIC PROCEDURE TODAY AT [facility]:   Refer to the procedure report that was given to you for any specific questions about what was found during the examination.  If the procedure report does not answer your questions, please call your gastroenterologist to clarify.  If you requested that your care partner not be given the details of your procedure findings, then the procedure report has been included in a sealed envelope for you to review at your convenience later.  YOU SHOULD EXPECT: Some feelings of bloating in the abdomen. Passage of more gas than usual.  Walking can help get rid of the air that was put into your GI tract during the procedure and reduce the bloating. If you had a lower endoscopy (such as a colonoscopy or flexible sigmoidoscopy) you may notice spotting of blood in your stool or on the toilet paper. If you underwent a bowel prep for your procedure, you may not have a normal bowel movement for a few days.  Please Note:  You might notice some irritation and congestion in your nose or some drainage.  This is from the oxygen used during your procedure.  There is no need for concern and it should clear up in a day or so.  SYMPTOMS TO REPORT IMMEDIATELY:  Following lower endoscopy (colonoscopy or flexible sigmoidoscopy):  Excessive amounts of blood in the stool  Significant tenderness or worsening of abdominal pains  Swelling of the abdomen that is new, acute  Fever of 100F or higher  For urgent or emergent issues, a gastroenterologist can be reached at any hour by calling [phone]. Do not use MyChart messaging for urgent concerns.    DIET:  We do recommend a small meal at first, but then you may proceed to your regular diet.  Drink  plenty of fluids but you should avoid alcoholic beverages for 24 hours.  ACTIVITY:  You should plan to take it easy for the rest of today and you should NOT DRIVE or use heavy machinery until tomorrow (because of the sedation medicines used during the test).    FOLLOW UP: Our staff will call the number listed on your records the next business day following your procedure.  We will call around 7:15- 8:00 am to check on you and address any questions or concerns that you may have regarding the information given to you following your procedure. If we do not reach you, we will leave a message.     If any biopsies were taken you will be contacted by phone or by letter within the next 1-3 weeks.  Please call us at [phone] if you have not heard about the biopsies in 3 weeks.    SIGNATURES/CONFIDENTIALITY: You and/or your care partner have signed paperwork which will be entered into your electronic medical record.  These signatures attest to the fact that that the information above on your After Visit Summary has been reviewed and is understood.  Full responsibility of the confidentiality of this discharge information lies with you and/or your care-partner.

## 2023-02-04 NOTE — Progress Notes (Signed)
Called to room to assist during endoscopic procedure.  Patient ID and intended procedure confirmed with present staff. Received instructions for my participation in the procedure from the performing physician.  

## 2023-02-05 ENCOUNTER — Telehealth: Payer: Self-pay

## 2023-02-05 NOTE — Telephone Encounter (Signed)
Attempted follow up call; no answer; left VM. 

## 2023-02-10 ENCOUNTER — Encounter: Payer: Self-pay | Admitting: Gastroenterology

## 2023-03-13 ENCOUNTER — Other Ambulatory Visit: Payer: Self-pay | Admitting: Internal Medicine

## 2023-03-13 DIAGNOSIS — I10 Essential (primary) hypertension: Secondary | ICD-10-CM

## 2023-03-24 DIAGNOSIS — E039 Hypothyroidism, unspecified: Secondary | ICD-10-CM | POA: Diagnosis not present

## 2023-04-16 DIAGNOSIS — G4700 Insomnia, unspecified: Secondary | ICD-10-CM | POA: Diagnosis not present

## 2023-04-16 DIAGNOSIS — F331 Major depressive disorder, recurrent, moderate: Secondary | ICD-10-CM | POA: Diagnosis not present

## 2023-04-16 DIAGNOSIS — F411 Generalized anxiety disorder: Secondary | ICD-10-CM | POA: Diagnosis not present

## 2023-06-04 DIAGNOSIS — R102 Pelvic and perineal pain: Secondary | ICD-10-CM | POA: Diagnosis not present

## 2023-06-10 DIAGNOSIS — R102 Pelvic and perineal pain: Secondary | ICD-10-CM | POA: Diagnosis not present

## 2023-06-12 DIAGNOSIS — N9489 Other specified conditions associated with female genital organs and menstrual cycle: Secondary | ICD-10-CM | POA: Diagnosis not present

## 2023-07-18 ENCOUNTER — Other Ambulatory Visit: Payer: Self-pay

## 2023-07-18 ENCOUNTER — Ambulatory Visit: Payer: BC Managed Care – PPO | Admitting: Internal Medicine

## 2023-07-18 DIAGNOSIS — Q231 Congenital insufficiency of aortic valve: Secondary | ICD-10-CM

## 2023-07-18 DIAGNOSIS — I351 Nonrheumatic aortic (valve) insufficiency: Secondary | ICD-10-CM

## 2023-08-05 ENCOUNTER — Ambulatory Visit (HOSPITAL_COMMUNITY): Payer: BC Managed Care – PPO | Attending: Cardiology

## 2023-08-05 DIAGNOSIS — I351 Nonrheumatic aortic (valve) insufficiency: Secondary | ICD-10-CM | POA: Diagnosis not present

## 2023-08-05 DIAGNOSIS — Q231 Congenital insufficiency of aortic valve: Secondary | ICD-10-CM | POA: Insufficient documentation

## 2023-08-05 LAB — ECHOCARDIOGRAM COMPLETE
Area-P 1/2: 3.6 cm2
P 1/2 time: 517 ms
S' Lateral: 3.2 cm

## 2023-08-07 DIAGNOSIS — F339 Major depressive disorder, recurrent, unspecified: Secondary | ICD-10-CM | POA: Diagnosis not present

## 2023-08-07 DIAGNOSIS — E038 Other specified hypothyroidism: Secondary | ICD-10-CM | POA: Diagnosis not present

## 2023-08-07 DIAGNOSIS — E063 Autoimmune thyroiditis: Secondary | ICD-10-CM | POA: Diagnosis not present

## 2023-08-07 DIAGNOSIS — Z1330 Encounter for screening examination for mental health and behavioral disorders, unspecified: Secondary | ICD-10-CM | POA: Diagnosis not present

## 2023-09-05 ENCOUNTER — Encounter: Payer: Self-pay | Admitting: Internal Medicine

## 2023-09-09 NOTE — Telephone Encounter (Signed)
Appointment for pt's husband has been scheduled to see Dr. Jacques Navy.

## 2023-09-18 ENCOUNTER — Ambulatory Visit: Payer: BC Managed Care – PPO | Attending: Internal Medicine | Admitting: Internal Medicine

## 2023-09-18 ENCOUNTER — Encounter: Payer: Self-pay | Admitting: Internal Medicine

## 2023-09-18 VITALS — BP 136/70 | HR 69 | Ht 65.0 in | Wt 140.0 lb

## 2023-09-18 DIAGNOSIS — I10 Essential (primary) hypertension: Secondary | ICD-10-CM | POA: Diagnosis not present

## 2023-09-18 DIAGNOSIS — I351 Nonrheumatic aortic (valve) insufficiency: Secondary | ICD-10-CM | POA: Diagnosis not present

## 2023-09-18 DIAGNOSIS — Q2381 Bicuspid aortic valve: Secondary | ICD-10-CM | POA: Diagnosis not present

## 2023-09-18 NOTE — Progress Notes (Signed)
Cardiology Office Note:  .   Date:  09/18/2023  ID:  Lisa Lynn, DOB 1961-07-05, MRN [id number] PCP: Etta Grandchild, MD  [facility] Providers Cardiologist:  Parke Poisson, MD    History of Present Illness: .   Lisa Lynn is a 62 y.o. female.  Discussed the use of AI scribe software for clinical note transcription with the patient, who gave verbal consent to proceed.  History of Present Illness   The patient, with a known history of bicuspid aortic valve and a brief period of hypertension in the past year, presents for a routine follow-up. The patient reports no new concerns or changes in health status. The patient's blood pressure at home is usually around 120-125 mmHg systolic, and she has been able to discontinue the blood pressure medication she was previously on due to symptoms of low BP. The patient's most recent echocardiogram, done six weeks prior, showed stable findings. The patient also had a coronary CT two years ago, which showed normal coronary arteries and a calcium score of zero. The patient is currently on Zepbound. The patient's cholesterol levels are slightly high, with an LDL cholesterol of 131, but the good cholesterol is also high.        ROS: negative except per HPI above.  Studies Reviewed: Marland Kitchen   EKG Interpretation Date/Time:  Thursday September 18 2023 09:12:15 EDT Ventricular Rate:  69 PR Interval:  172 QRS Duration:  72 QT Interval:  394 QTC Calculation: 422 R Axis:   -24  Text Interpretation: Normal sinus rhythm Normal ECG No previous ECGs available Confirmed by Weston Brass ([id number]) on 09/18/2023 9:53:23 AM    Results   LABS Blood counts: Normal (07/16/2023) Platelets: Normal (07/16/2023) Fasting blood glucose: Normal (07/16/2023) Renal function: Normal (07/16/2023) Sodium: Normal (07/16/2023) Potassium: Normal (07/16/2023) Hepatic function: Normal (07/16/2023) LDL: 131 mg/dL (42/70/6237) HDL: 71 mg/dL  (62/83/1517) Triglycerides: 72 mg/dL (61/60/7371) Hemoglobin A1c: 5.1% (07/16/2023) Lipoprotein A: Normal (07/16/2023)  RADIOLOGY Coronary CT: Calcium score 0, normal coronary arteries (2022)  DIAGNOSTIC Echocardiogram: Bicuspid aortic valve with mild to moderate regurgitation, left ventricle ejection fraction 60-65%, no aortic dilation (08/06/2023)     Risk Assessment/Calculations:             Physical Exam:   VS:  BP 136/70 (BP Location: Left Arm, Patient Position: Sitting, Cuff Size: Normal)   Pulse 69   Ht 5\' 5"  (1.651 m)   Wt 140 lb (63.5 kg)   BMI 23.30 kg/m    Wt Readings from Last 3 Encounters:  09/18/23 140 lb (63.5 kg)  02/04/23 153 lb (69.4 kg)  01/21/23 153 lb (69.4 kg)     Physical Exam   CHEST: Lungs clear to auscultation.     GEN: Well nourished, well developed in no acute distress NECK: No JVD; No carotid bruits CARDIAC: RRR, no murmurs, rubs, gallops RESPIRATORY:  Clear to auscultation without rales, wheezing or rhonchi  ABDOMEN: Soft, non-tender, non-distended EXTREMITIES:  No edema; No deformity   ASSESSMENT AND PLAN: .    1. Nonrheumatic aortic valve insufficiency   2. Bicuspid aortic valve   3. Primary hypertension     Assessment and Plan    Bicuspid Aortic Valve with Regurgitation Mild to moderate aortic valve regurgitation, stable compared to prior year. No evidence of aortic dilation or coarctation. Discussed the potential for progressive valve leakage and the need for future intervention if the heart shows changes due to the leakage. -Continue annual echocardiograms to monitor valve  function and aortic size. Next echo in one year.  Hyperlipidemia LDL cholesterol slightly elevated at 131, increased from 118 the prior year. Discussed the potential benefits of statin therapy versus dietary modifications. Patient prefers to attempt dietary changes before considering medication. -Repeat lipid panel in six months to assess response to dietary  changes.  Prediabetes Improved glycemic control with Zepbound, with hemoglobin A1c dropping from 5.8 to 5.1. -Zepbound as prescribed.  Hypertension Previously experienced elevated blood pressure and was on medication, but currently controlled without medication. Blood pressure at home usually in the 120s/70s-80s. Discussed the importance of maintaining good blood pressure control to prevent potential aortic dilation associated with bicuspid aortic valve. -Continue monitoring blood pressure at home.  Follow-up -Return for office visit in one year with echocardiogram prior to visit. -Check lipid panel in six months.                Total time of encounter: 25 minutes total time of encounter, including 15 minutes spent in face-to-face patient care on the date of this encounter. This time includes coordination of care and counseling regarding above mentioned problem list. Remainder of non-face-to-face time involved reviewing chart documents/testing relevant to the patient encounter and documentation in the medical record. I have independently reviewed documentation from referring provider.   Weston Brass, MD, [facility] [place]  [facility]

## 2023-09-18 NOTE — Patient Instructions (Signed)
Medication Instructions:  Your physician recommends that you continue on your current medications as directed. Please refer to the Current Medication list given to you today.  *If you need a refill on your cardiac medications before your next appointment, please call your pharmacy*   Lab Work: Lipid Panel in about 6 months (Fasting) If you have labs (blood work) drawn today and your tests are completely normal, you will receive your results only by: MyChart Message (if you have MyChart) OR A paper copy in the mail If you have any lab test that is abnormal or we need to change your treatment, we will call you to review the results.   Testing/Procedures: Your physician has requested that you have an echocardiogram in about one year (prior to seeing Dr. Jacques Navy). Echocardiography is a painless test that uses sound waves to create images of your heart. It provides your doctor with information about the size and shape of your heart and how well your heart's chambers and valves are working. This procedure takes approximately one hour. There are no restrictions for this procedure. Please do NOT wear cologne, perfume, aftershave, or lotions (deodorant is allowed). Please arrive 15 minutes prior to your appointment time. This will take place at [address] [place]    Follow-Up: At [facility], you and your health needs are our priority.  As part of our continuing mission to provide you with exceptional heart care, we have created designated Provider Care Teams.  These Care Teams include your primary Cardiologist (physician) and Advanced Practice Providers (APPs -  Physician Assistants and Nurse Practitioners) who all work together to provide you with the care you need, when you need it.   Your next appointment:   12 month(s)  Provider:   Parke Poisson, MD

## 2023-12-30 DIAGNOSIS — M7542 Impingement syndrome of left shoulder: Secondary | ICD-10-CM | POA: Diagnosis not present

## 2023-12-30 DIAGNOSIS — M7502 Adhesive capsulitis of left shoulder: Secondary | ICD-10-CM | POA: Diagnosis not present

## 2024-01-16 ENCOUNTER — Other Ambulatory Visit: Payer: Self-pay | Admitting: Obstetrics and Gynecology

## 2024-01-16 DIAGNOSIS — E2839 Other primary ovarian failure: Secondary | ICD-10-CM

## 2024-01-16 DIAGNOSIS — Z130 Encounter for screening for diseases of the blood and blood-forming organs and certain disorders involving the immune mechanism: Secondary | ICD-10-CM | POA: Diagnosis not present

## 2024-01-16 DIAGNOSIS — Z01419 Encounter for gynecological examination (general) (routine) without abnormal findings: Secondary | ICD-10-CM | POA: Diagnosis not present

## 2024-01-16 DIAGNOSIS — Z1231 Encounter for screening mammogram for malignant neoplasm of breast: Secondary | ICD-10-CM | POA: Diagnosis not present

## 2024-01-19 DIAGNOSIS — M7502 Adhesive capsulitis of left shoulder: Secondary | ICD-10-CM | POA: Diagnosis not present

## 2024-01-19 DIAGNOSIS — M7542 Impingement syndrome of left shoulder: Secondary | ICD-10-CM | POA: Diagnosis not present

## 2024-01-21 ENCOUNTER — Ambulatory Visit
Admission: RE | Admit: 2024-01-21 | Discharge: 2024-01-21 | Disposition: A | Discharge status: Home / Self Care | Source: Ambulatory Visit | Attending: Obstetrics and Gynecology | Admitting: Obstetrics and Gynecology

## 2024-01-21 DIAGNOSIS — N958 Other specified menopausal and perimenopausal disorders: Secondary | ICD-10-CM | POA: Diagnosis not present

## 2024-01-21 DIAGNOSIS — E2839 Other primary ovarian failure: Secondary | ICD-10-CM

## 2024-01-27 DIAGNOSIS — M7502 Adhesive capsulitis of left shoulder: Secondary | ICD-10-CM | POA: Diagnosis not present

## 2024-01-27 DIAGNOSIS — M7542 Impingement syndrome of left shoulder: Secondary | ICD-10-CM | POA: Diagnosis not present

## 2024-02-02 DIAGNOSIS — M7502 Adhesive capsulitis of left shoulder: Secondary | ICD-10-CM | POA: Diagnosis not present

## 2024-02-02 DIAGNOSIS — M7542 Impingement syndrome of left shoulder: Secondary | ICD-10-CM | POA: Diagnosis not present

## 2024-03-09 DIAGNOSIS — M7542 Impingement syndrome of left shoulder: Secondary | ICD-10-CM | POA: Diagnosis not present

## 2024-03-09 DIAGNOSIS — M7502 Adhesive capsulitis of left shoulder: Secondary | ICD-10-CM | POA: Diagnosis not present

## 2024-04-06 DIAGNOSIS — L821 Other seborrheic keratosis: Secondary | ICD-10-CM | POA: Diagnosis not present

## 2024-04-06 DIAGNOSIS — L738 Other specified follicular disorders: Secondary | ICD-10-CM | POA: Diagnosis not present

## 2024-04-06 DIAGNOSIS — L218 Other seborrheic dermatitis: Secondary | ICD-10-CM | POA: Diagnosis not present

## 2024-04-06 DIAGNOSIS — D1801 Hemangioma of skin and subcutaneous tissue: Secondary | ICD-10-CM | POA: Diagnosis not present

## 2024-05-10 ENCOUNTER — Encounter: Payer: Self-pay | Admitting: Internal Medicine

## 2024-05-25 DIAGNOSIS — I10 Essential (primary) hypertension: Secondary | ICD-10-CM | POA: Diagnosis not present

## 2024-05-25 DIAGNOSIS — Q2381 Bicuspid aortic valve: Secondary | ICD-10-CM | POA: Diagnosis not present

## 2024-05-25 DIAGNOSIS — I351 Nonrheumatic aortic (valve) insufficiency: Secondary | ICD-10-CM | POA: Diagnosis not present

## 2024-05-26 LAB — LIPID PANEL
Chol/HDL Ratio: 2.8 ratio (ref 0.0–4.4)
Cholesterol, Total: 204 mg/dL — ABNORMAL HIGH (ref 100–199)
HDL: 73 mg/dL (ref >39)
LDL Chol Calc (NIH): 122 mg/dL — ABNORMAL HIGH (ref 0–99)
Triglycerides: 51 mg/dL (ref 0–149)
VLDL Cholesterol Cal: 9 mg/dL (ref 5–40)

## 2024-06-07 ENCOUNTER — Ambulatory Visit: Payer: Self-pay | Admitting: Internal Medicine

## 2024-07-07 ENCOUNTER — Other Ambulatory Visit: Payer: Self-pay | Admitting: Family Medicine

## 2024-07-07 ENCOUNTER — Ambulatory Visit
Admission: RE | Admit: 2024-07-07 | Discharge: 2024-07-07 | Disposition: A | Discharge status: Home / Self Care | Source: Ambulatory Visit | Attending: Family Medicine | Admitting: Family Medicine

## 2024-07-07 DIAGNOSIS — M79671 Pain in right foot: Secondary | ICD-10-CM

## 2024-07-07 DIAGNOSIS — M79672 Pain in left foot: Secondary | ICD-10-CM

## 2024-07-20 ENCOUNTER — Encounter: Payer: Self-pay | Admitting: *Deleted

## 2024-07-26 DIAGNOSIS — M79671 Pain in right foot: Secondary | ICD-10-CM | POA: Diagnosis not present

## 2024-07-26 DIAGNOSIS — M79672 Pain in left foot: Secondary | ICD-10-CM | POA: Diagnosis not present

## 2024-09-15 ENCOUNTER — Ambulatory Visit (HOSPITAL_COMMUNITY)
Admission: RE | Admit: 2024-09-15 | Discharge: 2024-09-15 | Disposition: A | Discharge status: Home / Self Care | Payer: Self-pay | Source: Ambulatory Visit | Attending: Cardiology | Admitting: Cardiology

## 2024-09-15 DIAGNOSIS — I10 Essential (primary) hypertension: Secondary | ICD-10-CM | POA: Insufficient documentation

## 2024-09-15 DIAGNOSIS — I351 Nonrheumatic aortic (valve) insufficiency: Secondary | ICD-10-CM | POA: Diagnosis not present

## 2024-09-15 DIAGNOSIS — Q2381 Bicuspid aortic valve: Secondary | ICD-10-CM | POA: Insufficient documentation

## 2024-09-15 LAB — ECHOCARDIOGRAM COMPLETE
AR max vel: 1.47 cm2
AV Area VTI: 1.39 cm2
AV Area mean vel: 1.41 cm2
AV Mean grad: 8 mmHg
AV Peak grad: 11.4 mmHg
Ao pk vel: 1.69 m/s
Area-P 1/2: 3.56 cm2
P 1/2 time: 433 ms
S' Lateral: 3.5 cm

## 2024-10-07 ENCOUNTER — Ambulatory Visit: Payer: Self-pay | Attending: Internal Medicine | Admitting: Internal Medicine

## 2024-10-07 VITALS — BP 110/58 | HR 65 | Ht 65.0 in | Wt 136.6 lb

## 2024-10-07 DIAGNOSIS — Q2381 Bicuspid aortic valve: Secondary | ICD-10-CM | POA: Diagnosis not present

## 2024-10-07 DIAGNOSIS — E7800 Pure hypercholesterolemia, unspecified: Secondary | ICD-10-CM

## 2024-10-07 DIAGNOSIS — I351 Nonrheumatic aortic (valve) insufficiency: Secondary | ICD-10-CM

## 2024-10-07 DIAGNOSIS — I10 Essential (primary) hypertension: Secondary | ICD-10-CM | POA: Diagnosis not present

## 2024-10-07 NOTE — Patient Instructions (Addendum)
 Medication Instructions:  No Changes *If you need a refill on your cardiac medications before your next appointment, please call your pharmacy*  Lab Work: Please have your PCP's office fax us  your most recent labs to [phone] (Attn: Dr. Soyla Merck)  (You need a Hemoglobin and Hematocrit level within 3 months of your Cardiac MRI)  Testing/Procedures:  Scheduled for 10/19/2024 at 8:00 am (arriving at 7:30 am)  Your Cardiac MRI will be scheduled at the location below.   [facility] [address] [place], [place] [address] Please take advantage of the free valet parking available at the [facility] [employer] (Entrance C).  Proceed to the [facility] Radiology Department (First Floor) for check-in.   She will need go through entrance C (womens and childrens) right across from [place] office, and then to radiology dept   Magnetic resonance imaging (MRI) is a painless test that produces images of the inside of the body without using Xrays.  During an MRI, strong magnets and radio waves work together in a data processing manager to form detailed images.   MRI images may provide more details about a medical condition than X-rays, CT scans, and ultrasounds can provide.  You may be given earphones to listen for instructions.  You may eat a light breakfast and take medications as ordered with the exception of furosemide, hydrochlorothiazide, chlorthalidone or spironolactone (or any other fluid pill). If you are undergoing a stress MRI, please avoid stimulants for 12 hr prior to test. (I.e. Caffeine, nicotine, chocolate, or antihistamine medications)  If your provider has ordered anti-anxiety medications for this test, then you will need a driver.  An IV will be inserted into one of your veins. Contrast material will be injected into your IV. It will leave your body through your urine within a day. You may be told to drink plenty of fluids to help flush the contrast  material out of your system.  You will be asked to remove all metal, including: Watch, jewelry, and other metal objects including hearing aids, hair pieces and dentures. Also wearable glucose monitoring systems (ie. Freestyle Libre and Omnipods) (Braces and fillings normally are not a problem.)   TEST WILL TAKE APPROXIMATELY 1 HOUR  PLEASE NOTIFY SCHEDULING AT LEAST 24 HOURS IN ADVANCE IF YOU ARE UNABLE TO KEEP YOUR APPOINTMENT. [phone]  For more information and frequently asked questions, please visit our website : [email or web address]  Please call the Cardiac Imaging Nurse Navigators with any questions/concerns. [phone] Office    Follow-Up: At [facility], you and your health needs are our priority.  As part of our continuing mission to provide you with exceptional heart care, our providers are all part of one team.  This team includes your primary Cardiologist (physician) and Advanced Practice Providers or APPs (Physician Assistants and Nurse Practitioners) who all work together to provide you with the care you need, when you need it.  Your next appointment:   1 year(s) (Call in August or September of 2026 for a November 2026 appoinment)  Provider:   Gayatri A Acharya, MD   Other Instructions Please call us  or send a MyChart message with any Cardiology related questions/concerns.  [phone].  Thank you!

## 2024-10-07 NOTE — Progress Notes (Signed)
 Cardiology Office Note:  .   Date:  10/07/2024  ID:  [name], DOB Feb 13, 1961, MRN [id number] PCP: Waddell Rake, MD  [facility] Providers Cardiologist:  Soyla DELENA Merck, MD    History of Present Illness: .   Lisa Lynn is a 63 y.o. female.  Discussed the use of AI scribe software for clinical note transcription with the patient, who gave verbal consent to proceed.  History of Present Illness Lisa Lynn is a 63 year old female with a history of bicuspid aortic valve who presents with fatigue and concerns about heart function.  She has progressive fatigue that is worsening but overall function well day to day. Recent labs showed normal iron and ferritin. Rheumatoid arthritis testing was negative and further lupus testing is planned.  She has a bicuspid aortic valve monitored for over 20 years. She has remained active.  She takes low-dose oral minoxidil for hair loss and Wellbutrin for depression. She has not noticed hypotensino or other symptoms that limit activity.  Her current medications include levothyroxine  50 mcg daily, Zepbound 5 mg weekly, and aspirin. Her blood pressure has been lower than before, which she attributes to weight loss or minoxidil. She has not had symptoms of hypotension such as dizziness or syncope.  Her cholesterol has improved compared with last year, and her hemoglobin A1c is now normal after starting Zepbound for weight and glucose control.  Her father underwent valve replacement at age 59. She is unsure if he also had a bicuspid valve.  She denies snoring or symptoms suggestive of sleep apnea.    ROS: negative except per HPI above.  Studies Reviewed: SABRA   EKG Interpretation Date/Time:  Thursday October 07 2024 08:55:10 EST Ventricular Rate:  65 PR Interval:  194 QRS Duration:  74 QT Interval:  420 QTC Calculation: 436 R Axis:   74  Text Interpretation: Normal sinus rhythm Normal ECG Confirmed by  Merck Soyla ([id number]) on 10/07/2024 9:02:50 AM    Results LABS Ferritin: High Rheumatoid Arthritis Panel: Negative LDL: 122 (05/2024) Hemoglobin A1c: 5.1 (2024)  RADIOLOGY Coronary CT: No coronary calcification (2022)  DIAGNOSTIC Echocardiogram: Bicuspid aortic valve with mild regurgitation and mild stenosis; possible left ventricular enlargement; normal systolic function EKG: Normal (10/07/2024) Risk Assessment/Calculations:       Physical Exam:   VS:  BP (!) 110/58 (BP Location: Left Arm, Patient Position: Sitting, Cuff Size: Normal)   Pulse 65   Ht 5' 5 (1.651 m)   Wt 136 lb 9.6 oz (62 kg)   SpO2 97%   BMI 22.73 kg/m    Wt Readings from Last 3 Encounters:  10/07/24 136 lb 9.6 oz (62 kg)  09/18/23 140 lb (63.5 kg)  02/04/23 153 lb (69.4 kg)     Physical Exam GENERAL: Alert, cooperative, well developed, no acute distress. HEENT: Normocephalic, normal oropharynx, moist mucous membranes. CHEST: Clear to auscultation bilaterally, no wheezes, rhonchi, or crackles. CARDIOVASCULAR: Normal heart rate and rhythm, S1 and S2 normal without murmurs, EKG normal rhythm. ABDOMEN: Soft, non-tender, non-distended, without organomegaly, normal bowel sounds. EXTREMITIES: No cyanosis or edema. NEUROLOGICAL: Cranial nerves grossly intact, moves all extremities without gross motor or sensory deficit.   ASSESSMENT AND PLAN: .    Assessment and Plan Assessment & Plan Bicuspid aortic valve with no stenosis and mild-mod regurgitation Recent echocardiogram indicated mild leakage and possible stenosis. Evaluated potential heart dilation due to AI. - Ordered heart MRI to assess heart size, pumping function, and valve leakage. -  Scheduled MRI before the end of the year.  Fatigue Worsening fatigue undergoing PCP workup. Ruled out sleep apnea due to absence of snoring. - Consider home sleep test if fatigue persists and no snoring is reported.  Hyperlipidemia LDL cholesterol slightly  elevated at 122 mg/dL, improved from last year. No coronary calcification on previous CT. Current management effective. - Continue current management with lifestyle modifications  Hashimoto's thyroiditis Managed with levothyroxine  50 mcg daily. Fatigue has been worsening over time. - Continue levothyroxine  50 mcg daily.        Soyla Merck, MD, FACC

## 2024-10-13 ENCOUNTER — Encounter (HOSPITAL_COMMUNITY): Payer: Self-pay

## 2024-10-19 ENCOUNTER — Other Ambulatory Visit: Payer: Self-pay | Admitting: Internal Medicine

## 2024-10-19 ENCOUNTER — Ambulatory Visit (HOSPITAL_COMMUNITY)
Admission: RE | Admit: 2024-10-19 | Discharge: 2024-10-19 | Disposition: A | Source: Ambulatory Visit | Attending: Internal Medicine

## 2024-10-19 ENCOUNTER — Ambulatory Visit: Payer: Self-pay | Admitting: Internal Medicine

## 2024-10-19 DIAGNOSIS — Q2381 Bicuspid aortic valve: Secondary | ICD-10-CM

## 2024-10-19 DIAGNOSIS — I351 Nonrheumatic aortic (valve) insufficiency: Secondary | ICD-10-CM | POA: Diagnosis not present

## 2024-10-19 MED ORDER — GADOBUTROL 1 MMOL/ML IV SOLN
9.0000 mL | Freq: Once | INTRAVENOUS | Status: AC | PRN
  Administered 2024-10-19: 9 mL via INTRAVENOUS

## 2024-10-27 DIAGNOSIS — M5416 Radiculopathy, lumbar region: Secondary | ICD-10-CM | POA: Diagnosis not present

## 2024-11-01 DIAGNOSIS — M5416 Radiculopathy, lumbar region: Secondary | ICD-10-CM | POA: Diagnosis not present
